# Patient Record
Sex: Male | Born: 1955 | Race: White | Hispanic: No | Marital: Married | Smoking: Never smoker
Health system: Southern US, Community
[De-identification: ages and names within clinical notes are randomized; demographics above are authoritative.]

## PROBLEM LIST (undated history)

## (undated) DIAGNOSIS — N281 Cyst of kidney, acquired: Secondary | ICD-10-CM

## (undated) DIAGNOSIS — I10 Essential (primary) hypertension: Secondary | ICD-10-CM

## (undated) DIAGNOSIS — S86819A Strain of other muscle(s) and tendon(s) at lower leg level, unspecified leg, initial encounter: Secondary | ICD-10-CM

## (undated) DIAGNOSIS — R569 Unspecified convulsions: Secondary | ICD-10-CM

## (undated) DIAGNOSIS — K219 Gastro-esophageal reflux disease without esophagitis: Secondary | ICD-10-CM

## (undated) DIAGNOSIS — Z87442 Personal history of urinary calculi: Secondary | ICD-10-CM

## (undated) DIAGNOSIS — S8010XA Contusion of unspecified lower leg, initial encounter: Secondary | ICD-10-CM

## (undated) DIAGNOSIS — IMO0001 Reserved for inherently not codable concepts without codable children: Secondary | ICD-10-CM

## (undated) DIAGNOSIS — I491 Atrial premature depolarization: Secondary | ICD-10-CM

## (undated) HISTORY — PX: APPENDECTOMY: SHX54

## (undated) HISTORY — DX: Strain of other muscle(s) and tendon(s) at lower leg level, unspecified leg, initial encounter: S86.819A

## (undated) HISTORY — DX: Contusion of unspecified lower leg, initial encounter: S80.10XA

## (undated) HISTORY — DX: Essential (primary) hypertension: I10

## (undated) HISTORY — DX: Cyst of kidney, acquired: N28.1

## (undated) HISTORY — PX: GROIN MASS OPEN BIOPSY: SHX1714

## (undated) HISTORY — PX: FRACTURE SURGERY: SHX138

## (undated) HISTORY — DX: Gastro-esophageal reflux disease without esophagitis: K21.9

---

## 2006-02-21 ENCOUNTER — Ambulatory Visit: Payer: Self-pay | Admitting: Internal Medicine

## 2009-01-31 ENCOUNTER — Ambulatory Visit: Payer: Self-pay | Admitting: Internal Medicine

## 2009-08-23 ENCOUNTER — Ambulatory Visit: Payer: Self-pay | Admitting: Internal Medicine

## 2012-10-09 DIAGNOSIS — R569 Unspecified convulsions: Secondary | ICD-10-CM

## 2012-10-09 HISTORY — DX: Unspecified convulsions: R56.9

## 2013-04-15 ENCOUNTER — Ambulatory Visit: Payer: Self-pay | Admitting: Family Medicine

## 2014-07-02 ENCOUNTER — Other Ambulatory Visit: Payer: Self-pay

## 2014-07-02 ENCOUNTER — Ambulatory Visit: Payer: Self-pay | Admitting: Physician Assistant

## 2014-07-02 LAB — COMPREHENSIVE METABOLIC PANEL
ALBUMIN: 4.1 g/dL (ref 3.4–5.0)
ALK PHOS: 139 U/L — AB
ALT: 62 U/L
ANION GAP: 7 (ref 7–16)
BILIRUBIN TOTAL: 0.6 mg/dL (ref 0.2–1.0)
BUN: 14 mg/dL (ref 7–18)
CALCIUM: 8.9 mg/dL (ref 8.5–10.1)
Chloride: 103 mmol/L (ref 98–107)
Co2: 31 mmol/L (ref 21–32)
Creatinine: 1.58 mg/dL — ABNORMAL HIGH (ref 0.60–1.30)
EGFR (Non-African Amer.): 48 — ABNORMAL LOW
GFR CALC AF AMER: 58 — AB
GLUCOSE: 110 mg/dL — AB (ref 65–99)
OSMOLALITY: 282 (ref 275–301)
Potassium: 4 mmol/L (ref 3.5–5.1)
SGOT(AST): 26 U/L (ref 15–37)
Sodium: 141 mmol/L (ref 136–145)
TOTAL PROTEIN: 7.3 g/dL (ref 6.4–8.2)

## 2014-07-02 LAB — URINALYSIS, COMPLETE
Bilirubin,UR: NEGATIVE
Glucose,UR: NEGATIVE
KETONE: NEGATIVE
Leukocyte Esterase: NEGATIVE
NITRITE: NEGATIVE
PROTEIN: NEGATIVE
Ph: 5.5 (ref 5.0–8.0)
RBC,UR: >30 /HPF (ref 0–5)
SPECIFIC GRAVITY: 1.02 (ref 1.000–1.030)
WBC UR: NONE SEEN /HPF (ref 0–5)

## 2014-07-02 LAB — CBC WITH DIFFERENTIAL/PLATELET
BASOS PCT: 0.5 %
Basophil #: 0 10*3/uL (ref 0.0–0.1)
EOS ABS: 0 10*3/uL (ref 0.0–0.7)
Eosinophil %: 0.5 %
HCT: 49.3 % (ref 40.0–52.0)
HGB: 16.6 g/dL (ref 13.0–18.0)
Lymphocyte #: 1.1 10*3/uL (ref 1.0–3.6)
Lymphocyte %: 12.3 %
MCH: 32.3 pg (ref 26.0–34.0)
MCHC: 33.7 g/dL (ref 32.0–36.0)
MCV: 96 fL (ref 80–100)
MONO ABS: 0.7 x10 3/mm (ref 0.2–1.0)
Monocyte %: 7.5 %
NEUTROS ABS: 7.2 10*3/uL — AB (ref 1.4–6.5)
NEUTROS PCT: 79.2 %
PLATELETS: 118 10*3/uL — AB (ref 150–440)
RBC: 5.15 10*6/uL (ref 4.40–5.90)
RDW: 13.7 % (ref 11.5–14.5)
WBC: 9.1 10*3/uL (ref 3.8–10.6)

## 2014-07-31 DIAGNOSIS — N281 Cyst of kidney, acquired: Secondary | ICD-10-CM | POA: Insufficient documentation

## 2014-07-31 DIAGNOSIS — N200 Calculus of kidney: Secondary | ICD-10-CM | POA: Insufficient documentation

## 2014-07-31 HISTORY — DX: Cyst of kidney, acquired: N28.1

## 2015-02-13 ENCOUNTER — Ambulatory Visit
Admission: EM | Admit: 2015-02-13 | Discharge: 2015-02-13 | Disposition: A | Discharge status: Home / Self Care | Payer: 59 | Attending: Internal Medicine | Admitting: Internal Medicine

## 2015-02-13 DIAGNOSIS — J321 Chronic frontal sinusitis: Secondary | ICD-10-CM | POA: Insufficient documentation

## 2015-02-13 DIAGNOSIS — H66003 Acute suppurative otitis media without spontaneous rupture of ear drum, bilateral: Secondary | ICD-10-CM

## 2015-02-13 DIAGNOSIS — Z79899 Other long term (current) drug therapy: Secondary | ICD-10-CM | POA: Insufficient documentation

## 2015-02-13 DIAGNOSIS — J0110 Acute frontal sinusitis, unspecified: Secondary | ICD-10-CM | POA: Diagnosis not present

## 2015-02-13 DIAGNOSIS — H66013 Acute suppurative otitis media with spontaneous rupture of ear drum, bilateral: Secondary | ICD-10-CM | POA: Insufficient documentation

## 2015-02-13 DIAGNOSIS — J069 Acute upper respiratory infection, unspecified: Secondary | ICD-10-CM | POA: Diagnosis present

## 2015-02-13 HISTORY — DX: Gastro-esophageal reflux disease without esophagitis: K21.9

## 2015-02-13 HISTORY — DX: Unspecified convulsions: R56.9

## 2015-02-13 HISTORY — DX: Reserved for inherently not codable concepts without codable children: IMO0001

## 2015-02-13 LAB — RAPID INFLUENZA A&B ANTIGENS (ARMC ONLY)
INFLUENZA A (ARMC): NOT DETECTED
INFLUENZA B (ARMC): NOT DETECTED

## 2015-02-13 LAB — RAPID STREP SCREEN (MED CTR MEBANE ONLY): STREPTOCOCCUS, GROUP A SCREEN (DIRECT): NEGATIVE

## 2015-02-13 MED ORDER — SALINE SPRAY 0.65 % NA SOLN: 2.0000 | NASAL | Status: DC

## 2015-02-13 MED ORDER — AMOXICILLIN-POT CLAVULANATE 875-125 MG PO TABS: 1.0000 | ORAL_TABLET | Freq: Two times a day (BID) | ORAL | Status: DC

## 2015-02-13 MED ORDER — FLUTICASONE PROPIONATE 50 MCG/ACT NA SUSP: 1.0000 | Freq: Two times a day (BID) | NASAL | Status: DC

## 2015-02-13 NOTE — ED Notes (Signed)
Patient cough, cold, sinus pressure congestion all week, Not getting relief with OTC meds. Thinks it might be moving into his chest

## 2015-02-13 NOTE — ED Provider Notes (Addendum)
CSN: [id number]     Arrival date & time 02/13/15  0810 History   First MD Initiated Contact with Patient 02/13/15 [phone]     Chief Complaint  Patient presents with  . URI   (Consider location/radiation/quality/duration/timing/severity/associated sxs/prior Treatment) Patient is a 59 y.o. male presenting with URI. The history is provided by the patient.  URI Presenting symptoms: congestion, cough, ear pain, facial pain, fatigue, fever, rhinorrhea and sore throat   Congestion:    Location:  Nasal and chest   Interferes with sleep: no     Interferes with eating/drinking: no   Cough:    Cough characteristics:  Productive   Sputum characteristics:  Manson PasseyBrown   Severity:  Moderate   Onset quality:  Gradual   Duration:  1 week   Timing:  Intermittent   Progression:  Worsening   Chronicity:  New Ear pain:    Location:  Bilateral   Severity:  Mild   Onset quality:  Gradual   Duration:  1 week   Progression:  Worsening   Chronicity:  New Fever:    Duration:  2 days   Timing:  Intermittent   Temp source:  Subjective   Progression:  Worsening Rhinorrhea:    Quality:  Yellow   Severity:  Moderate   Duration:  1 week   Timing:  Intermittent   Progression:  Worsening Sore throat:    Severity:  Moderate   Onset quality:  Gradual   Duration:  2 days   Timing:  Constant   Progression:  Worsening Severity:  Moderate Onset quality:  Gradual Duration:  1 week Timing:  Intermittent Progression:  Worsening Chronicity:  New Relieved by:  Nothing Ineffective treatments:  OTC medications and decongestant Associated symptoms: arthralgias, headaches, myalgias, sinus pain and sneezing   Associated symptoms: no neck pain, no swollen glands and no wheezing   Headaches:    Severity:  Moderate   Onset quality:  Gradual   Duration:  2 days   Timing:  Intermittent   Progression:  Worsening   Chronicity:  New Risk factors: sick contacts   Risk factors: no chronic cardiac disease, no chronic  kidney disease, no chronic respiratory disease, no diabetes mellitus, no immunosuppression, no recent illness and no recent travel   Patient reported 61 year old grandson sick when visited him one week ago.  Coughing up brown mucous since 2 May productive cough, worse at night.  Tylenol and sudafed helping but feels bad when they wear off and worse at night symptoms worsening overall in severity.  Forehead headache, nasal and chest congestion, whole head pressure including ears, frequent cough, joint and body aches.  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  Past Medical History  Diagnosis Date  . Seizures   . Reflux   glasses   History reviewed. No pertinent past surgical history. Family History  Problem Relation Age of Onset  . Cancer Mother   . Diabetes Mother    History  Substance Use Topics  . Smoking status: Never Smoker   . Smokeless tobacco: Never Used  . Alcohol Use: No    Review of Systems  Constitutional: Positive for fever, chills and fatigue. Negative for diaphoresis.  HENT: Positive for congestion, ear pain, rhinorrhea, sneezing and sore throat.   Eyes: Negative.   Respiratory: Positive for cough. Negative for apnea, choking, chest tightness, shortness of breath, wheezing and stridor.   Cardiovascular: Negative.   Gastrointestinal: Negative.   Genitourinary: Negative for dysuria and difficulty urinating.  Musculoskeletal: Positive for  myalgias and arthralgias. Negative for neck pain.  Skin: Negative.   Allergic/Immunologic: Negative.   Neurological: Positive for headaches. Negative for dizziness, tremors, seizures, syncope, facial asymmetry, speech difficulty, weakness, light-headedness and numbness.  Hematological: Negative.   Psychiatric/Behavioral: Negative.     Allergies  Review of patient's allergies indicates no known allergies.  Home Medications   Prior to Admission medications   Medication Sig Start Date End Date Taking? Authorizing Provider  omeprazole (PRILOSEC) 40 MG  capsule Take 40 mg by mouth daily.   Yes Historical Provider, MD  OXcarbazepine (TRILEPTAL) 150 MG tablet Take 150 mg by mouth 2 (two) times daily.   Yes Historical Provider, MD  amoxicillin-clavulanate (AUGMENTIN) 875-125 MG per tablet Take 1 tablet by mouth 2 (two) times daily. 02/13/15   Barbaraann Barthel, NP  fluticasone (FLONASE) 50 MCG/ACT nasal spray Place 1 spray into both nostrils 2 (two) times daily. 02/13/15   Barbaraann Barthel, NP  sodium chloride (OCEAN) 0.65 % SOLN nasal spray Place 2 sprays into both nostrils every 2 (two) hours while awake. 02/13/15   Jarold Song Shakeitha Umbaugh, NP   BP 163/89 mmHg  Pulse 62  Temp(Src) 98 F (36.7 C) (Tympanic)  Resp 20  Ht  (1.93 m)  Wt 238 lb (107.956 kg)  BMI 28.98 kg/m2  SpO2 98% Physical Exam  Constitutional: He is oriented to person, place, and time. Vital signs are normal. He appears well-developed and well-nourished.  HENT:  Head: Normocephalic and atraumatic.  Right Ear: Hearing, external ear and ear canal normal. Tympanic membrane is injected and erythematous. A middle ear effusion is present.  Left Ear: Hearing, external ear and ear canal normal. Tympanic membrane is injected and erythematous. A middle ear effusion is present.  Nose: Nose normal.  Mouth/Throat: Oropharynx is clear and moist. No oropharyngeal exudate.  Vascular excoriated bilateral TMs with erythema and opacity to air fluid level 50% left greater than right; cobblestoning posterior pharynx; bilateral turbinates with edema/erythema clear discharge; TTP maxillary and frontal sinuses right greater than left  Eyes: Conjunctivae, EOM and lids are normal. Pupils are equal, round, and reactive to light. Right eye exhibits no discharge. Left eye exhibits no discharge. No scleral icterus.  Neck: Trachea normal and normal range of motion. Neck supple. No JVD present. No tracheal deviation present. No thyromegaly present.  Cardiovascular: Normal rate, regular rhythm, normal heart  sounds and intact distal pulses.  Exam reveals no gallop and no friction rub.   No murmur heard. Pulmonary/Chest: Effort normal and breath sounds normal. No stridor. No respiratory distress. He has no wheezes. He has no rales. He exhibits no tenderness.  Abdominal: He exhibits no distension.  Musculoskeletal: Normal range of motion. He exhibits no edema or tenderness.  Lymphadenopathy:    He has no cervical adenopathy.  Neurological: He is alert and oriented to person, place, and time.  Skin: Skin is warm, dry and intact. No rash noted. No erythema. No pallor.  Psychiatric: He has a normal mood and affect. His speech is normal and behavior is normal. Judgment and thought content normal. Cognition and memory are normal.  Nursing note and vitals reviewed.   ED Course  Procedures (including critical care time) Labs Review Labs Reviewed  INFLUENZA A&B ANTIGENS([facility])  RAPID STREP SCREEN  CULTURE, GROUP A STREP [facility])  1030 Patient notified rapid strep and flu negative.  Throat culture pending over next 48 hours and will contact him via telephone with results once available.  Patient verbalized understanding of information/instructions,  agreed with plan of care and had no further questions at this time.  Imaging Review No results found.  Non toxic and well hydrated.  I do not see where any further testing or imaging is necessary at this time.   I will suggest supportive care, rest, good hygiene and encourage the patient to take adequate fluids.  The patient is to return to clinic or EMERGENCY ROOM if symptoms worsen or change significantly.  Exitcare handout on sinusitis given to patient. Symptomatic therapy suggested fluids, NSAIDs and rest.  May take Tylenol or Motrin for fevers.  Call or return to clinic as needed if these symptoms worsen or fail to improve as anticipated. Exitcare handout on otitis media given to patient.   Patient verbalized agreement and understanding of treatment plan and  had no further questions at this time.   P2:  Hand washing and cover cough  MDM   1. Acute frontal sinusitis, recurrence not specified   2. Acute suppurative otitis media of both ears without spontaneous rupture of tympanic membranes, recurrence not specified    17 Feb 2015 at 0742 patient contacted via telephone and notified throat culture negative for strep throat.  Patient reported symptoms gradually improving with prescribed therapy.  Patient verbalized understanding of information and had no further questions at this time.  Barbaraann Barthelina A Carder Yin, NP 02/13/15 1135  Barbaraann Barthelina A Christianna Belmonte, NP 02/17/15 [phone]

## 2015-02-13 NOTE — Discharge Instructions (Signed)
Sinusitis  Sinusitis is redness, soreness, and inflammation of the paranasal sinuses. Paranasal sinuses are air pockets within the bones of your face (beneath the eyes, the middle of the forehead, or above the eyes). In healthy paranasal sinuses, mucus is able to drain out, and air is able to circulate through them by way of your nose. However, when your paranasal sinuses are inflamed, mucus and air can become trapped. This can allow bacteria and other germs to grow and cause infection.  Sinusitis can develop quickly and last only a short time (acute) or continue over a long period (chronic). Sinusitis that lasts for more than 12 weeks is considered chronic.   CAUSES   Causes of sinusitis include:   Allergies.   Structural abnormalities, such as displacement of the cartilage that separates your nostrils (deviated septum), which can decrease the air flow through your nose and sinuses and affect sinus drainage.   Functional abnormalities, such as when the small hairs (cilia) that line your sinuses and help remove mucus do not work properly or are not present.  SIGNS AND SYMPTOMS   Symptoms of acute and chronic sinusitis are the same. The primary symptoms are pain and pressure around the affected sinuses. Other symptoms include:   Upper toothache.   Earache.   Headache.   Bad breath.   Decreased sense of smell and taste.   A cough, which worsens when you are lying flat.   Fatigue.   Fever.   Thick drainage from your nose, which often is green and may contain pus (purulent).   Swelling and warmth over the affected sinuses.  DIAGNOSIS   Your health care provider will perform a physical exam. During the exam, your health care provider may:   Look in your nose for signs of abnormal growths in your nostrils (nasal polyps).   Tap over the affected sinus to check for signs of infection.   View the inside of your sinuses (endoscopy) using an imaging device that has a light attached (endoscope).  If your health  care provider suspects that you have chronic sinusitis, one or more of the following tests may be recommended:   Allergy tests.   Nasal culture. A sample of mucus is taken from your nose, sent to a lab, and screened for bacteria.   Nasal cytology. A sample of mucus is taken from your nose and examined by your health care provider to determine if your sinusitis is related to an allergy.  TREATMENT   Most cases of acute sinusitis are related to a viral infection and will resolve on their own within 10 days. Sometimes medicines are prescribed to help relieve symptoms (pain medicine, decongestants, nasal steroid sprays, or saline sprays).   However, for sinusitis related to a bacterial infection, your health care provider will prescribe antibiotic medicines. These are medicines that will help kill the bacteria causing the infection.   Rarely, sinusitis is caused by a fungal infection. In theses cases, your health care provider will prescribe antifungal medicine.  For some cases of chronic sinusitis, surgery is needed. Generally, these are cases in which sinusitis recurs more than 3 times per year, despite other treatments.  HOME CARE INSTRUCTIONS    Drink plenty of water. Water helps thin the mucus so your sinuses can drain more easily.   Use a humidifier.   Inhale steam 3 to 4 times a day (for example, sit in the bathroom with the shower running).   Apply a warm, moist washcloth to your face   3 to 4 times a day, or as directed by your health care provider.   Use saline nasal sprays to help moisten and clean your sinuses.   Take medicines only as directed by your health care provider.   If you were prescribed either an antibiotic or antifungal medicine, finish it all even if you start to feel better.  SEEK IMMEDIATE MEDICAL CARE IF:   You have increasing pain or severe headaches.   You have nausea, vomiting, or drowsiness.   You have swelling around your face.   You have vision problems.   You have a stiff  neck.   You have difficulty breathing.  MAKE SURE YOU:    Understand these instructions.   Will watch your condition.   Will get help right away if you are not doing well or get worse.  Document Released: 09/25/2005 Document Revised: 02/09/2014 Document Reviewed: 10/10/2011  ExitCare Patient Information 2015 [employer]. This information is not intended to replace advice given to you by your health care provider. Make sure you discuss any questions you have with your health care provider.  Otitis Media  Otitis media is redness, soreness, and inflammation of the middle ear. Otitis media may be caused by allergies or, most commonly, by infection. Often it occurs as a complication of the common cold.  SIGNS AND SYMPTOMS  Symptoms of otitis media may include:   Earache.   Fever.   Ringing in your ear.   Headache.   Leakage of fluid from the ear.  DIAGNOSIS  To diagnose otitis media, your health care provider will examine your ear with an otoscope. This is an instrument that allows your health care provider to see into your ear in order to examine your eardrum. Your health care provider also will ask you questions about your symptoms.  TREATMENT   Typically, otitis media resolves on its own within 3-5 days. Your health care provider may prescribe medicine to ease your symptoms of pain. If otitis media does not resolve within 5 days or is recurrent, your health care provider may prescribe antibiotic medicines if he or she suspects that a bacterial infection is the cause.  HOME CARE INSTRUCTIONS    If you were prescribed an antibiotic medicine, finish it all even if you start to feel better.   Take medicines only as directed by your health care provider.   Keep all follow-up visits as directed by your health care provider.  SEEK MEDICAL CARE IF:   You have otitis media only in one ear, or bleeding from your nose, or both.   You notice a lump on your neck.   You are not getting better in 3-5 days.   You  feel worse instead of better.  SEEK IMMEDIATE MEDICAL CARE IF:    You have pain that is not controlled with medicine.   You have swelling, redness, or pain around your ear or stiffness in your neck.   You notice that part of your face is paralyzed.   You notice that the bone behind your ear (mastoid) is tender when you touch it.  MAKE SURE YOU:    Understand these instructions.   Will watch your condition.   Will get help right away if you are not doing well or get worse.  Document Released: 06/30/2004 Document Revised: 02/09/2014 Document Reviewed: 04/22/2013  ExitCare Patient Information 2015 [employer]. This information is not intended to replace advice given to you by your health care provider. Make sure you   discuss any questions you have with your health care provider.

## 2015-02-16 LAB — CULTURE, GROUP A STREP (THRC)

## 2016-02-03 ENCOUNTER — Encounter: Payer: Self-pay | Admitting: *Deleted

## 2016-02-03 ENCOUNTER — Ambulatory Visit
Admission: EM | Admit: 2016-02-03 | Discharge: 2016-02-03 | Disposition: A | Discharge status: Home / Self Care | Payer: 59 | Attending: Family Medicine | Admitting: Family Medicine

## 2016-02-03 DIAGNOSIS — J0100 Acute maxillary sinusitis, unspecified: Secondary | ICD-10-CM | POA: Diagnosis not present

## 2016-02-03 LAB — RAPID STREP SCREEN (MED CTR MEBANE ONLY): Streptococcus, Group A Screen (Direct): NEGATIVE

## 2016-02-03 LAB — RAPID INFLUENZA A&B ANTIGENS: Influenza B (ARMC): NEGATIVE

## 2016-02-03 LAB — RAPID INFLUENZA A&B ANTIGENS (ARMC ONLY): INFLUENZA A (ARMC): NEGATIVE

## 2016-02-03 MED ORDER — FEXOFENADINE-PSEUDOEPHED ER 180-240 MG PO TB24: 1.0000 | ORAL_TABLET | Freq: Every day | ORAL | Status: DC

## 2016-02-03 MED ORDER — FLUTICASONE PROPIONATE 50 MCG/ACT NA SUSP: 2.0000 | Freq: Every day | NASAL | Status: DC

## 2016-02-03 MED ORDER — AMOXICILLIN-POT CLAVULANATE 875-125 MG PO TABS: 1.0000 | ORAL_TABLET | Freq: Two times a day (BID) | ORAL | Status: DC

## 2016-02-03 NOTE — ED Notes (Signed)
Productive cough-green, fever, sore throat, headache, body aches, fatigue, x1 week.

## 2016-02-03 NOTE — Discharge Instructions (Signed)
Sinusitis, Adult  Sinusitis is redness, soreness, and puffiness (inflammation) of the air pockets in the bones of your face (sinuses). The redness, soreness, and puffiness can cause air and mucus to get trapped in your sinuses. This can allow germs to grow and cause an infection.   HOME CARE    Drink enough fluids to keep your pee (urine) clear or pale yellow.   Use a humidifier in your home.   Run a hot shower to create steam in the bathroom. Sit in the bathroom with the door closed. Breathe in the steam 3-4 times a day.   Put a warm, moist washcloth on your face 3-4 times a day, or as told by your doctor.   Use salt water sprays (saline sprays) to wet the thick fluid in your nose. This can help the sinuses drain.   Only take medicine as told by your doctor.  GET HELP RIGHT AWAY IF:    Your pain gets worse.   You have very bad headaches.   You are sick to your stomach (nauseous).   You throw up (vomit).   You are very sleepy (drowsy) all the time.   Your face is puffy (swollen).   Your vision changes.   You have a stiff neck.   You have trouble breathing.  MAKE SURE YOU:    Understand these instructions.   Will watch your condition.   Will get help right away if you are not doing well or get worse.     This information is not intended to replace advice given to you by your health care provider. Make sure you discuss any questions you have with your health care provider.     Document Released: 03/13/2008 Document Revised: 10/16/2014 Document Reviewed: 04/30/2012  Elsevier Interactive Patient Education 2016 [employer]

## 2016-02-03 NOTE — ED Provider Notes (Addendum)
CSN: [id number]     Arrival date & time 02/03/16  1423 History   First MD Initiated Contact with Patient 02/03/16 1507    Nurses notes were reviewed.  Chief Complaint  Patient presents with  . Cough  . Fever  . Sore Throat  . Headache  . Generalized Body Aches  . Fatigue    Patient was having a sinus infection for over week. No screen drainage coming from his nostril. States that he is not able to shake it. He's also had fever sore throat headache and generalized body aches with it as well. He states that he's not been able to have any improvement in the last 2 days is gotten a lot worse. Past medical history he's a history of reflux and seizure disorder. His mother's had cancer and mother also said diabetes. He never smoked and denies any medical allergies or significant medical problems other than mentioned above.    (Consider location/radiation/quality/duration/timing/severity/associated sxs/prior Treatment) Patient is a 60 y.o. male presenting with cough, fever, pharyngitis, and headaches.  Cough Cough characteristics:  Productive and non-productive Sputum characteristics:  Green, yellow and bloody Severity:  Moderate Onset quality:  Sudden Duration:  7 days Timing:  Constant Progression:  Worsening Chronicity:  New Context: upper respiratory infection   Context: not sick contacts and not smoke exposure   Relieved by:  Nothing Worsened by:  Nothing tried Associated symptoms: fever and headaches   Fever Associated symptoms: cough and headaches   Sore Throat Associated symptoms include headaches.  Headache Associated symptoms: cough and fever     Past Medical History  Diagnosis Date  . Seizures (HCC)   . Reflux    History reviewed. No pertinent past surgical history. Family History  Problem Relation Age of Onset  . Cancer Mother   . Diabetes Mother    Social History  Substance Use Topics  . Smoking status: Never Smoker   . Smokeless tobacco: Never Used  .  Alcohol Use: No    Review of Systems  Constitutional: Positive for fever.  Respiratory: Positive for cough.   Neurological: Positive for headaches.  All other systems reviewed and are negative.   Allergies  Review of patient's allergies indicates no known allergies.  Home Medications   Prior to Admission medications   Medication Sig Start Date End Date Taking? Authorizing Provider  omeprazole (PRILOSEC) 40 MG capsule Take 40 mg by mouth daily.   Yes Historical Provider, MD  OXcarbazepine (TRILEPTAL) 150 MG tablet Take 150 mg by mouth 2 (two) times daily.   Yes Historical Provider, MD  amoxicillin-clavulanate (AUGMENTIN) 875-125 MG per tablet Take 1 tablet by mouth 2 (two) times daily. 02/13/15   Barbaraann Barthel, NP  amoxicillin-clavulanate (AUGMENTIN) 875-125 MG tablet Take 1 tablet by mouth 2 (two) times daily. 02/03/16   Hassan Rowan, MD  fexofenadine-pseudoephedrine (ALLEGRA-D ALLERGY & CONGESTION) 180-240 MG 24 hr tablet Take 1 tablet by mouth daily. 02/03/16   Hassan Rowan, MD  fluticasone (FLONASE) 50 MCG/ACT nasal spray Place 1 spray into both nostrils 2 (two) times daily. 02/13/15   Barbaraann Barthel, NP  fluticasone (FLONASE) 50 MCG/ACT nasal spray Place 2 sprays into both nostrils daily. 02/03/16   Hassan Rowan, MD  sodium chloride (OCEAN) 0.65 % SOLN nasal spray Place 2 sprays into both nostrils every 2 (two) hours while awake. 02/13/15   Barbaraann Barthel, NP   Meds Ordered and Administered this Visit  Medications - No data to display  BP 164/94 mmHg  Pulse 67  Temp(Src) 98.2 F (36.8 C) (Oral)  Resp 16  Ht 6\' 4"  (1.93 m)  Wt 240 lb (108.863 kg)  BMI 29.23 kg/m2  SpO2 99% No data found.   Physical Exam  Constitutional: He is oriented to person, place, and time. He appears well-developed and well-nourished.  HENT:  Head: Normocephalic and atraumatic.  Eyes: Conjunctivae are normal.  Neck: Normal range of motion. Neck supple.  Cardiovascular: Normal rate and regular  rhythm.   Pulmonary/Chest: Effort normal and breath sounds normal.  Musculoskeletal: Normal range of motion. He exhibits no edema.  Neurological: He is alert and oriented to person, place, and time.  Skin: Skin is warm and dry.  Vitals reviewed.   ED Course  Procedures (including critical care time)  Labs Review Labs Reviewed  RAPID INFLUENZA A&B ANTIGENS ([facility] ONLY)  [facility]  CULTURE, GROUP A STREP [facility])    Imaging Review No results found.   Visual Acuity Review  Right Eye Distance:   Left Eye Distance:   Bilateral Distance:    Right Eye Near:   Left Eye Near:    Bilateral Near:       Results for orders placed or performed during the hospital encounter of 02/03/16  Rapid Influenza A&B Antigens ([facility] only)  Result Value Ref Range   Influenza A ([facility]) NEGATIVE NEGATIVE   Influenza B ([facility]) NEGATIVE NEGATIVE  Rapid strep screen  Result Value Ref Range   Streptococcus, Group A Screen (Direct) NEGATIVE NEGATIVE    MDM   1. Acute maxillary sinusitis, recurrence not specified    We'll place patient on Augmentin 875 one tablet twice a day. Allegra-D decongestant 1 tablet daily and Flonase nasal spray 2 puffs each nostril on a daily basis. Follow-up with PCP if not better in a week.      Note: This dictation was prepared with Dragon dictation along with smaller phrase technology. Any transcriptional errors that result from this process are unintentional.    Hassan RowanEugene Lillyth Spong, MD 02/03/16 1527  Hassan RowanEugene Annleigh Knueppel, MD 02/03/16 1527

## 2016-02-06 LAB — CULTURE, GROUP A STREP (THRC)

## 2016-03-10 DIAGNOSIS — S8010XA Contusion of unspecified lower leg, initial encounter: Secondary | ICD-10-CM | POA: Insufficient documentation

## 2016-03-10 DIAGNOSIS — S86819A Strain of other muscle(s) and tendon(s) at lower leg level, unspecified leg, initial encounter: Secondary | ICD-10-CM | POA: Insufficient documentation

## 2016-03-10 HISTORY — DX: Strain of other muscle(s) and tendon(s) at lower leg level, unspecified leg, initial encounter: S86.819A

## 2016-03-10 HISTORY — DX: Contusion of unspecified lower leg, initial encounter: S80.10XA

## 2016-04-10 DIAGNOSIS — M79606 Pain in leg, unspecified: Secondary | ICD-10-CM | POA: Insufficient documentation

## 2016-05-25 IMAGING — CT CT ABD-PELV W/O CM
2 of 4 series · 16 of 46 positions shown, 18 images · non-contrast
Comparison: None.

CLINICAL DATA: Left lower quadrant pain, hematuria

EXAM:
CT ABDOMEN AND PELVIS WITHOUT CONTRAST
TECHNIQUE: Multidetector CT imaging of the abdomen and pelvis was performed
following the standard protocol without IV contrast.

[Series 2: stone standard full · axial · 0.96mm/px · z∈[-1078,-563]mm · 13 of 113 slices shown, 15 images]
[im 5/113  soft-tissue]
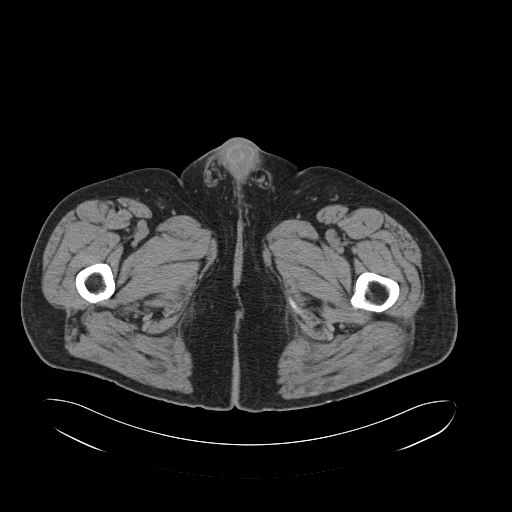
[im 5/113  bone]
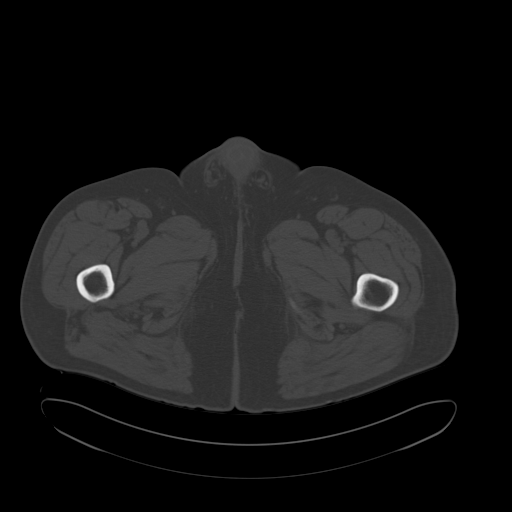
[im 13/113  soft-tissue]
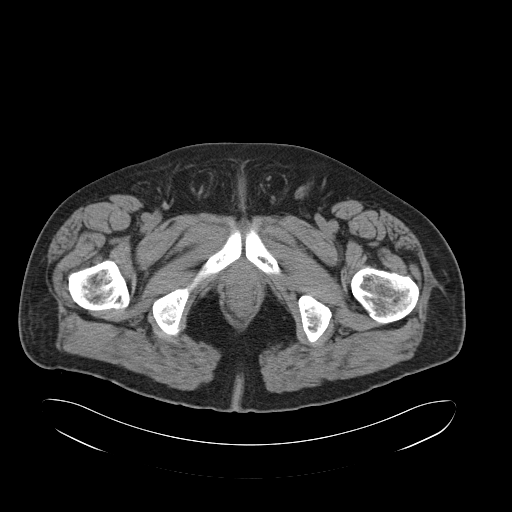
[im 22/113  soft-tissue]
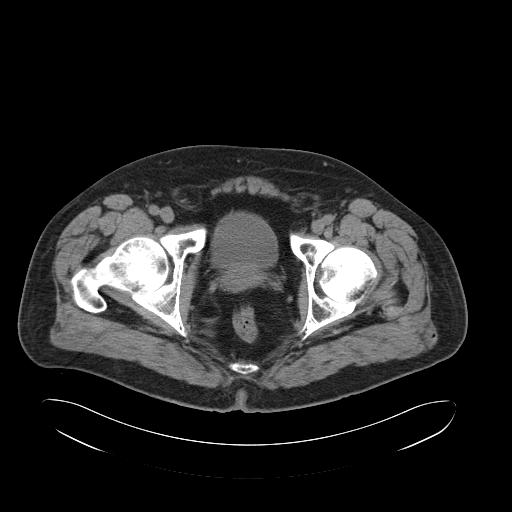
[im 31/113  soft-tissue]
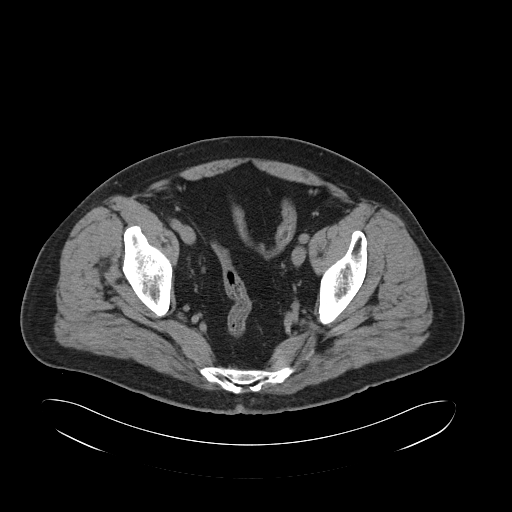
[im 39/113  soft-tissue]
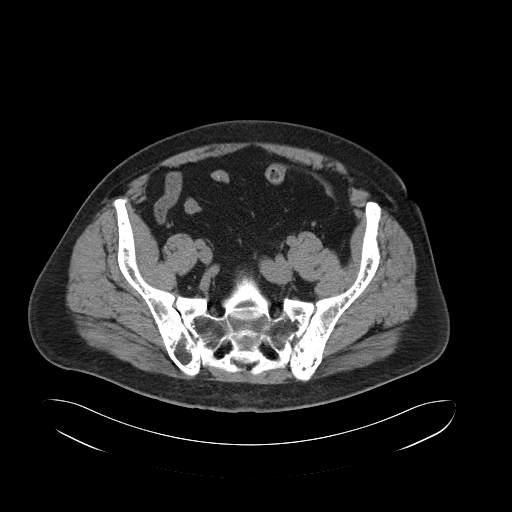
[im 48/113  soft-tissue]
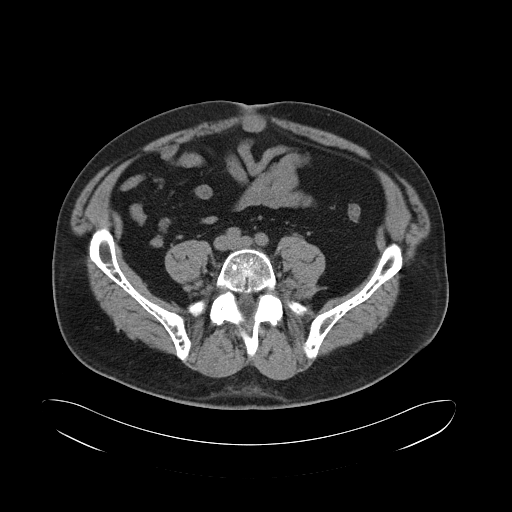
[im 57/113  soft-tissue]
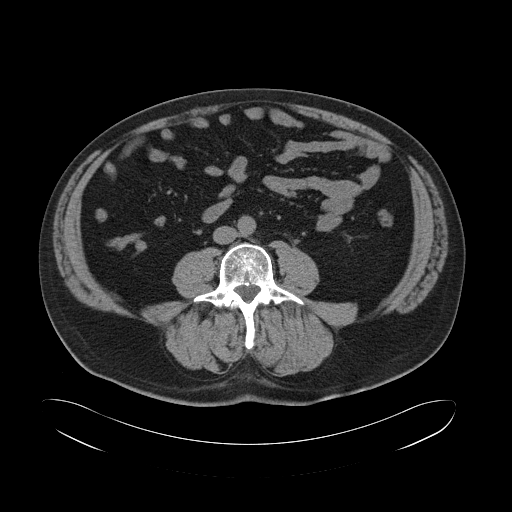
[im 65/113  soft-tissue]
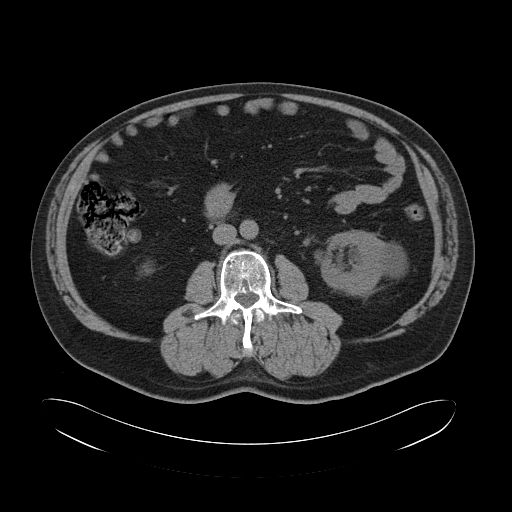
[im 74/113  soft-tissue]
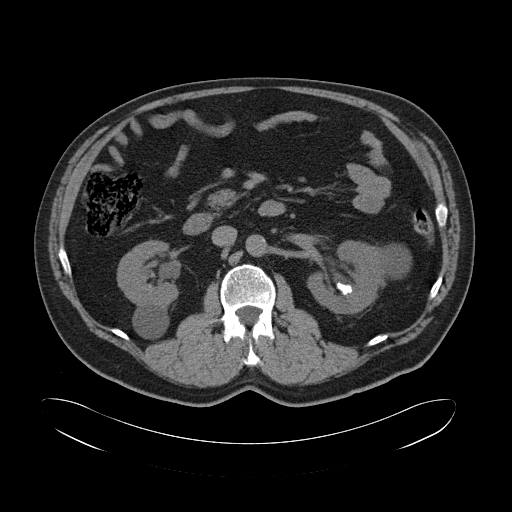
[im 74/113  bone]
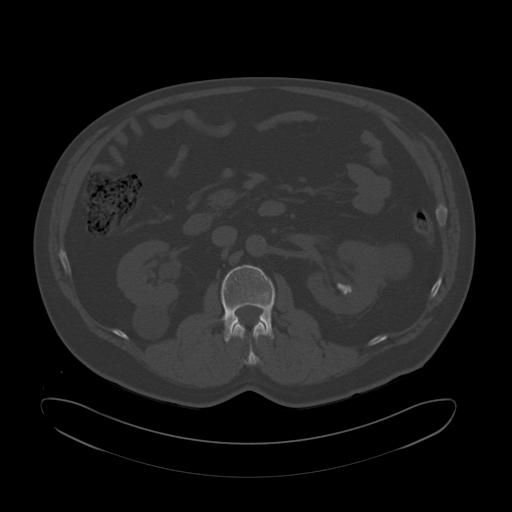
[im 82/113  soft-tissue]
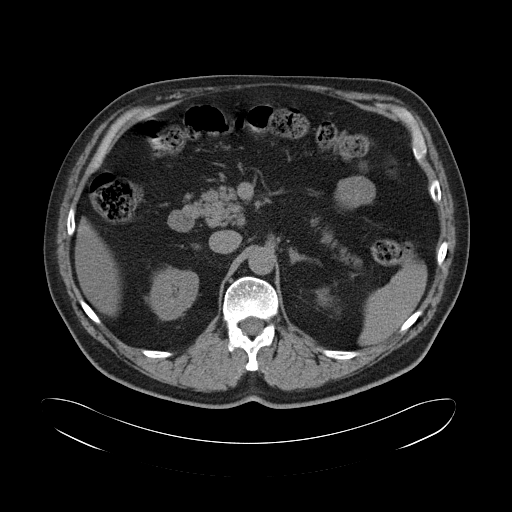
[im 91/113  soft-tissue]
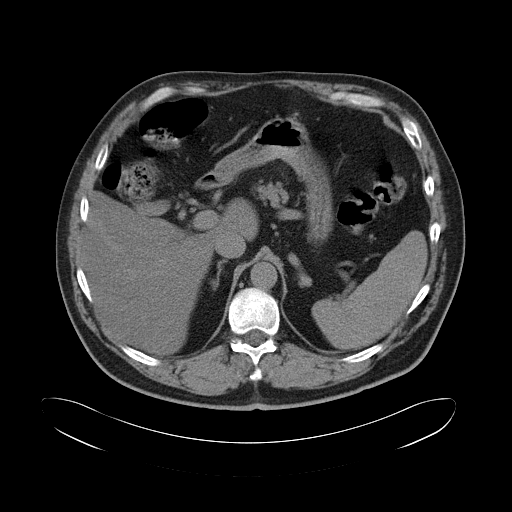
[im 100/113  soft-tissue]
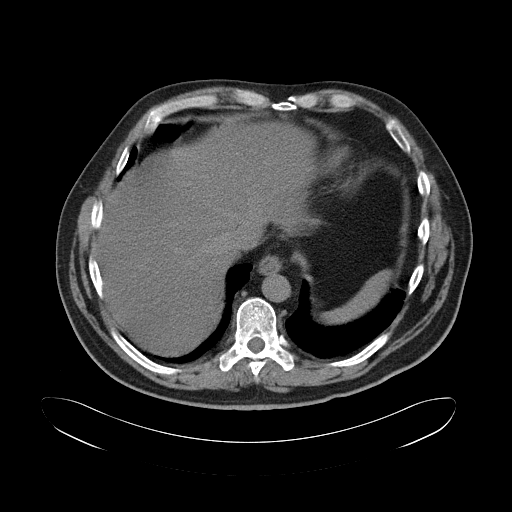
[im 108/113  soft-tissue]
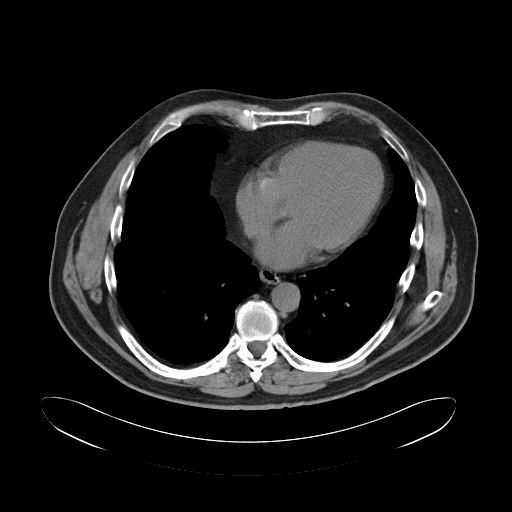

[Series 5: cor stone standard full · coronal · 0.89mm/px · 3 of 160 slices shown]
[im 54/160  soft-tissue]
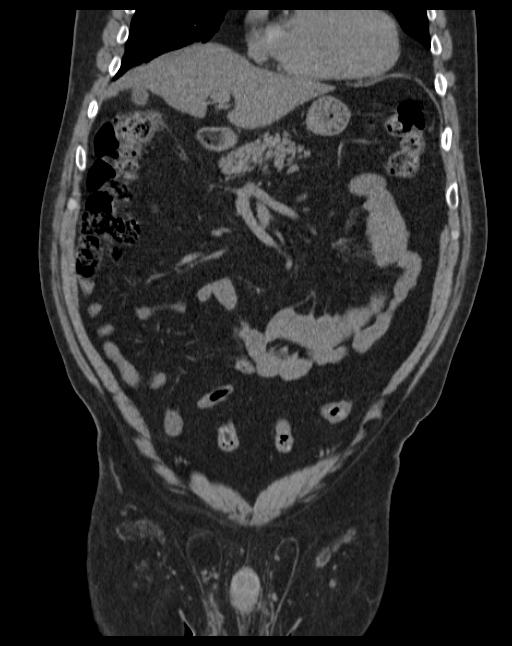
[im 71/160  soft-tissue]
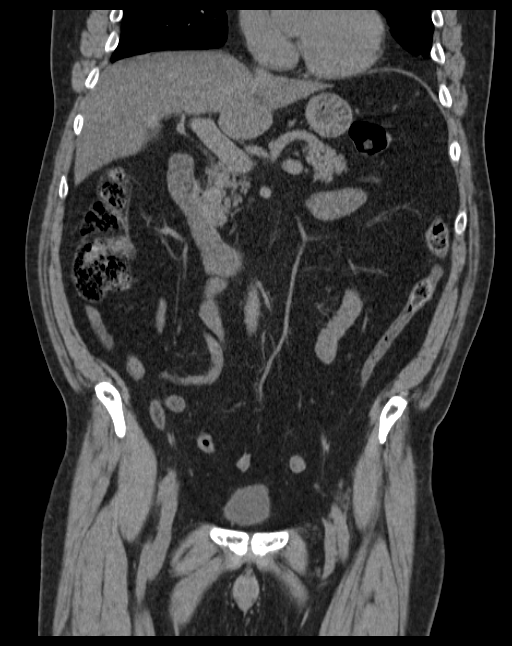
[im 89/160  soft-tissue]
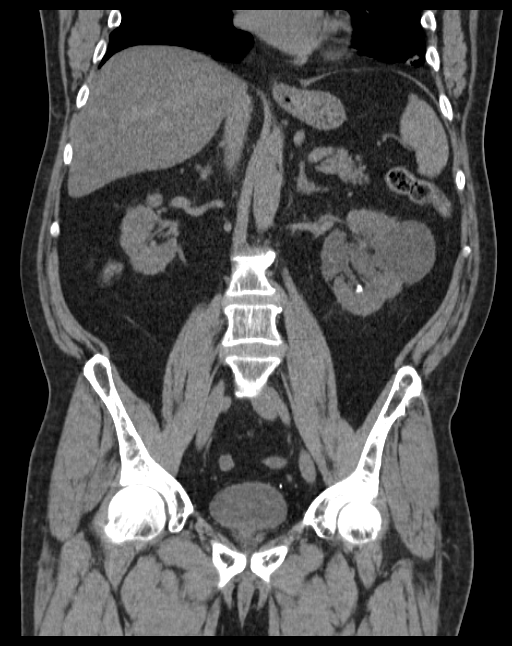

[16 of 46 positions shown; findings below may reference images not displayed]

FINDINGS: Mild subsegmental atelectasis seen dependently within the lung
bases. No pleural or pericardial effusion.

Diffuse hypoattenuation of the liver is consistent with steatosis.
Gallbladder is largely contracted but grossly normal. No biliary
dilatation. The spleen, adrenal glands, and pancreas demonstrate a
normal unenhanced appearance.

4 cm exophytic cyst seen extending from the posterior aspect of the
right kidney. Faint punctate calcification within the lower pole the
right kidney likely represents a small nonobstructive stone. No
hydronephrosis. No stones seen along the course of the right renal
collecting system.

On the left, there is an obstructive 4 mm calculus within the mid
left ureter (series 2, image 63). There is secondary mild to
moderate left hydroureteronephrosis. Additional large 1.1 x 1.5 cm
calculus present within the upper left kidney. 7 mm nonobstructive
calculus present within the lower pole left kidney. 5.5 x 5.7 cm
simple exophytic cyst present.

Stomach within normal limits. No evidence of bowel obstruction. No
abnormal wall thickening or inflammatory changes seen about the
bowels. No evidence for acute appendicitis.

Bilateral fat containing inguinal hernias present, right larger than
left

Bladder is largely decompressed but grossly normal. Prostate
unremarkable.

No free air or fluid.  No adenopathy.

No acute osseous abnormality. No worrisome lytic or blastic osseous
lesions. Benign hemangiomata noted within the T11 and L1 vertebral
bodies.
IMPRESSION: 1. Obstructive 4 mm calculus within the mid left ureter with
secondary mild to moderate left hydroureteronephrosis.
2. Additional nonobstructive left renal nephrolithiasis as above,
the largest of which measures 1.1 x 1.5 cm.
3. Punctate calcific density within the lower pole of the right
kidney, likely a small nonobstructive stone.
4. Bilateral simple renal cysts as above.
5. Hepatic steatosis.

## 2017-11-13 DIAGNOSIS — R569 Unspecified convulsions: Secondary | ICD-10-CM | POA: Insufficient documentation

## 2017-11-13 DIAGNOSIS — K219 Gastro-esophageal reflux disease without esophagitis: Secondary | ICD-10-CM

## 2017-11-13 DIAGNOSIS — I10 Essential (primary) hypertension: Secondary | ICD-10-CM | POA: Insufficient documentation

## 2017-11-13 HISTORY — DX: Gastro-esophageal reflux disease without esophagitis: K21.9

## 2017-11-13 HISTORY — DX: Essential (primary) hypertension: I10

## 2017-11-18 ENCOUNTER — Other Ambulatory Visit: Payer: Self-pay

## 2017-11-18 ENCOUNTER — Ambulatory Visit
Admission: EM | Admit: 2017-11-18 | Discharge: 2017-11-18 | Disposition: A | Discharge status: Home / Self Care | Payer: 59 | Attending: Family Medicine | Admitting: Family Medicine

## 2017-11-18 DIAGNOSIS — J0100 Acute maxillary sinusitis, unspecified: Secondary | ICD-10-CM | POA: Diagnosis not present

## 2017-11-18 HISTORY — DX: Essential (primary) hypertension: I10

## 2017-11-18 LAB — RAPID STREP SCREEN (MED CTR MEBANE ONLY): Streptococcus, Group A Screen (Direct): NEGATIVE

## 2017-11-18 MED ORDER — AMOXICILLIN 875 MG PO TABS: 875.0000 mg | ORAL_TABLET | Freq: Two times a day (BID) | ORAL | 0 refills | Status: DC

## 2017-11-18 NOTE — ED Provider Notes (Signed)
[facility]    CSN: [id number] Arrival date & time: 11/18/17  1523     History   Chief Complaint Chief Complaint  Patient presents with  . Nasal Congestion    HPI Tom Bauer is a 62 y.o. male.   The history is provided by the patient.  URI  Presenting symptoms: congestion, cough, facial pain, fatigue and sore throat   Severity:  Moderate Onset quality:  Sudden Duration:  10 days Timing:  Constant Progression:  Worsening Chronicity:  New Relieved by:  Nothing Ineffective treatments:  OTC medications Associated symptoms: headaches and sinus pain   Associated symptoms: no wheezing   Risk factors: sick contacts   Risk factors: not elderly, no chronic cardiac disease, no chronic kidney disease, no chronic respiratory disease, no diabetes mellitus, no immunosuppression, no recent illness and no recent travel     Past Medical History:  Diagnosis Date  . Hypertension   . Reflux   . Seizures (HCC)     There are no active problems to display for this patient.   History reviewed. No pertinent surgical history.     Home Medications    Prior to Admission medications   Medication Sig Start Date End Date Taking? Authorizing Provider  losartan-hydrochlorothiazide (HYZAAR) 100-12.5 MG tablet Take 1 tablet by mouth daily.   Yes [provider]  omeprazole (PRILOSEC) 40 MG capsule Take 40 mg by mouth daily.   Yes [provider]  OXcarbazepine (TRILEPTAL) 150 MG tablet Take 150 mg by mouth 2 (two) times daily.   Yes [provider]  amoxicillin (AMOXIL) 875 MG tablet Take 1 tablet (875 mg total) by mouth 2 (two) times daily. 11/18/17   Payton Mccallum, MD  amoxicillin-clavulanate (AUGMENTIN) 875-125 MG per tablet Take 1 tablet by mouth 2 (two) times daily. 02/13/15   Betancourt, Jarold Song, NP  amoxicillin-clavulanate (AUGMENTIN) 875-125 MG tablet Take 1 tablet by mouth 2 (two) times daily. 02/03/16   Hassan Rowan, MD    fexofenadine-pseudoephedrine (ALLEGRA-D ALLERGY & CONGESTION) 180-240 MG 24 hr tablet Take 1 tablet by mouth daily. 02/03/16   Hassan Rowan, MD  fluticasone (FLONASE) 50 MCG/ACT nasal spray Place 1 spray into both nostrils 2 (two) times daily. 02/13/15   Betancourt, Jarold Song, NP  fluticasone (FLONASE) 50 MCG/ACT nasal spray Place 2 sprays into both nostrils daily. 02/03/16   Hassan Rowan, MD  sodium chloride (OCEAN) 0.65 % SOLN nasal spray Place 2 sprays into both nostrils every 2 (two) hours while awake. 02/13/15   Betancourt, Jarold Song, NP    Family History Family History  Problem Relation Age of Onset  . Cancer Mother   . Diabetes Mother     Social History Social History   Tobacco Use  . Smoking status: Never Smoker  . Smokeless tobacco: Never Used  Substance Use Topics  . Alcohol use: No  . Drug use: No     Allergies   Patient has no known allergies.   Review of Systems Review of Systems  Constitutional: Positive for fatigue.  HENT: Positive for congestion, sinus pain and sore throat.   Respiratory: Positive for cough. Negative for wheezing.   Neurological: Positive for headaches.     Physical Exam Triage Vital Signs ED Triage Vitals  Enc Vitals Group     BP 11/18/17 1548 127/81     Pulse Rate 11/18/17 1548 88     Resp --      Temp 11/18/17 1548 (!) 97.5 F (36.4 C)  Temp Source 11/18/17 1548 Oral     SpO2 11/18/17 1548 99 %     Weight 11/18/17 1544 250 lb (113.4 kg)     Height 11/18/17 1544 6\' 4"  (1.93 m)     Head Circumference --      Peak Flow --      Pain Score 11/18/17 1544 7     Pain Loc --      Pain Edu? --      Excl. in GC? --    No data found.  Updated Vital Signs BP 127/81 (BP Location: Left Arm)   Pulse 88   Temp (!) 97.5 F (36.4 C) (Oral)   Ht 6\' 4"  (1.93 m)   Wt 250 lb (113.4 kg)   SpO2 99%   BMI 30.43 kg/m   Visual Acuity Right Eye Distance:   Left Eye Distance:   Bilateral Distance:    Right Eye Near:   Left Eye Near:     Bilateral Near:     Physical Exam  Constitutional: He appears well-developed and well-nourished. No distress.  HENT:  Head: Normocephalic and atraumatic.  Right Ear: Tympanic membrane, external ear and ear canal normal.  Left Ear: Tympanic membrane, external ear and ear canal normal.  Nose: Right sinus exhibits maxillary sinus tenderness and frontal sinus tenderness. Left sinus exhibits maxillary sinus tenderness and frontal sinus tenderness.  Mouth/Throat: Uvula is midline, oropharynx is clear and moist and mucous membranes are normal. No oropharyngeal exudate or tonsillar abscesses.  Eyes: Conjunctivae and EOM are normal. Pupils are equal, round, and reactive to light. Right eye exhibits no discharge. Left eye exhibits no discharge. No scleral icterus.  Neck: Normal range of motion. Neck supple. No tracheal deviation present. No thyromegaly present.  Cardiovascular: Normal rate, regular rhythm and normal heart sounds.  Pulmonary/Chest: Effort normal and breath sounds normal. No stridor. No respiratory distress. He has no wheezes. He has no rales. He exhibits no tenderness.  Lymphadenopathy:    He has no cervical adenopathy.  Neurological: He is alert.  Skin: Skin is warm and dry. No rash noted. He is not diaphoretic.  Nursing note and vitals reviewed.    UC Treatments / Results  Labs (all labs ordered are listed, but only abnormal results are displayed) Labs Reviewed  RAPID STREP SCREEN (NOT AT [facility])  CULTURE, GROUP A STREP [facility])    EKG  EKG Interpretation None       Radiology No results found.  Procedures Procedures (including critical care time)  Medications Ordered in UC Medications - No data to display   Initial Impression / Assessment and Plan / UC Course  I have reviewed the triage vital signs and the nursing notes.  Pertinent labs & imaging results that were available during my care of the patient were reviewed by me and considered in my medical decision  making (see chart for details).       Final Clinical Impressions(s) / UC Diagnoses   Final diagnoses:  Acute maxillary sinusitis, recurrence not specified    ED Discharge Orders        Ordered    amoxicillin (AMOXIL) 875 MG tablet  2 times daily     11/18/17 1649     1. diagnosis reviewed with patient 2. rx as per orders above; reviewed possible side effects, interactions, risks and benefits  3. Recommend supportive treatment with otc flonase  4. Follow-up prn if symptoms worsen or don't improve   Controlled Substance Prescriptions [place] Controlled Substance Registry  consulted? Not Applicable   Payton Mccallum, MD 11/18/17 [phone]

## 2017-11-18 NOTE — ED Triage Notes (Signed)
Patient c/o cough, sore throat and congestion x 10 days.

## 2017-11-21 ENCOUNTER — Telehealth: Payer: Self-pay | Admitting: Emergency Medicine

## 2017-11-21 LAB — CULTURE, GROUP A STREP (THRC)

## 2017-11-21 NOTE — Telephone Encounter (Signed)
Called patient to follow-up with patient regarding recent visit at [facility].  Patient instructed to call back with any questions or concerns. H. Anise Harbin RN 

## 2018-01-16 ENCOUNTER — Encounter: Payer: Self-pay | Admitting: Urology

## 2018-01-16 ENCOUNTER — Ambulatory Visit: Payer: 59 | Admitting: Urology

## 2018-01-16 VITALS — BP 156/83 | HR 60 | Resp 16 | Ht 75.0 in | Wt 256.7 lb

## 2018-01-16 DIAGNOSIS — R3129 Other microscopic hematuria: Secondary | ICD-10-CM | POA: Diagnosis not present

## 2018-01-16 DIAGNOSIS — R310 Gross hematuria: Secondary | ICD-10-CM

## 2018-01-16 LAB — MICROSCOPIC EXAMINATION: RBC, UA: >30 /HPF — AB (ref 0–2)

## 2018-01-16 LAB — URINALYSIS, COMPLETE
BILIRUBIN UA: NEGATIVE
Glucose, UA: NEGATIVE
Ketones, UA: NEGATIVE
Leukocytes, UA: NEGATIVE
NITRITE UA: NEGATIVE
PROTEIN UA: NEGATIVE
Specific Gravity, UA: 1.02 (ref 1.005–1.030)
UUROB: 0.2 mg/dL (ref 0.2–1.0)
pH, UA: 5.5 (ref 5.0–7.5)

## 2018-01-16 NOTE — Progress Notes (Signed)
01/16/2018 8:54 AM   Tom Bauer 08/29/1956 [id number]  Referring provider: Dortha KernBliss, Laura K, MD [address] [place], [place] [id number]  Chief Complaint  Patient presents with  . Hematuria    New patient    HPI: Tom Bauer is a 62 year old male seen at the request of Dr. Dareen PianoAnderson for evaluation of hematuria.  In mid March 2019 he had onset of total gross painless hematuria.  He states his urine was initially pink tinged and then became darker red without clots.  He denied dysuria, fever or chills.  He does have baseline urinary frequency, urgency and intermittent urinary stream.  He has had occasional dull bilateral back pain.  His hematuria resolved after 24 hours.  [facility].  A CT showed bilateral nephrolithiasis.  He had had a stone analysis showing 88% uric acid calculi.  A 24 urine study was recommended however apparently was never performed.   PMH: Past Medical History:  Diagnosis Date  . Hypertension   . Reflux   . Seizures (HCC)     Surgical History: No past surgical history on file.  Home Medications:  Allergies as of 01/16/2018   No Known Allergies     Medication List        Accurate as of 01/16/18  8:54 AM. Always use your most recent med list.          losartan-hydrochlorothiazide 100-12.5 MG tablet Commonly known as:  HYZAAR Take 1 tablet by mouth daily.   Oxcarbazepine 300 MG tablet Commonly known as:  TRILEPTAL Take 900 mg by mouth 2 (two) times daily.   torsemide 5 MG tablet Commonly known as:  DEMADEX Take 5 mg by mouth daily.       Allergies: No Known Allergies  Family History: Family History  Problem Relation Age of Onset  . Cancer Mother   . Diabetes Mother     Social History:  reports that he has never smoked. He has never used smokeless tobacco. He reports that he does not drink alcohol or use drugs.  ROS: UROLOGY Frequent Urination?: Yes Hard to postpone urination?:  Yes Burning/pain with urination?: No Get up at night to urinate?: Yes Leakage of urine?: No Urine stream starts and stops?: Yes Trouble starting stream?: No Do you have to strain to urinate?: No Blood in urine?: Yes Urinary tract infection?: No Sexually transmitted disease?: No Injury to kidneys or bladder?: No Painful intercourse?: No Weak stream?: No Erection problems?: Yes Penile pain?: No  Gastrointestinal Nausea?: No Vomiting?: No Indigestion/heartburn?: No Diarrhea?: No Constipation?: No  Constitutional Fever: No Night sweats?: No Weight loss?: No Fatigue?: No  Skin Skin rash/lesions?: No Itching?: No  Eyes Blurred vision?: No Double vision?: No  Ears/Nose/Throat Sore throat?: No Sinus problems?: No  Hematologic/Lymphatic Swollen glands?: No Easy bruising?: No  Cardiovascular Leg swelling?: Yes Chest pain?: No  Respiratory Cough?: No Shortness of breath?: No  Endocrine Excessive thirst?: No  Musculoskeletal Back pain?: No Joint pain?: No  Neurological Headaches?: No Dizziness?: No  Psychologic Depression?: No Anxiety?: No  Physical Exam: BP (!) 156/83   Pulse 60   Resp 16   Ht 6\' 3"  (1.905 m)   Wt 256 lb 11.2 oz (116.4 kg)   SpO2 98%   BMI 32.09 kg/m   Constitutional:  Alert and oriented, No acute distress. HEENT: [place] AT, moist mucus membranes.  Trachea midline, no masses. Cardiovascular: No clubbing, cyanosis, or edema. Respiratory: Normal respiratory  effort, no increased work of breathing. GI: Abdomen is soft, nontender, nondistended, no abdominal masses GU: No CVA tenderness Lymph: No cervical or inguinal lymphadenopathy. Skin: No rashes, bruises or suspicious lesions. Neurologic: Grossly intact, no focal deficits, moving all 4 extremities. Psychiatric: Normal mood and affect.  Laboratory Data: Lab Results  Component Value Date   WBC 9.1 07/02/2014   HGB 16.6 07/02/2014   HCT 49.3 07/02/2014   MCV 96 07/02/2014    PLT 118 (L) 07/02/2014    Lab Results  Component Value Date   CREATININE 1.58 (H) 07/02/2014   Urinalysis Dipstick: 2+ blood Microscopy: >30 RBC; 6-10 WBC  Assessment & Plan:   62 year old male with a history of stone disease and recent total gross painless hematuria.  I have recommended proceeding with a full hematuria evaluation to include CT urogram and cystoscopy.  He is in agreement and desires to proceed.    Riki Altes, MD  [facility] [address] [place], [place] [address] [phone]

## 2018-01-17 ENCOUNTER — Ambulatory Visit: Payer: Self-pay | Admitting: Urology

## 2018-02-08 ENCOUNTER — Ambulatory Visit
Admission: RE | Admit: 2018-02-08 | Discharge: 2018-02-08 | Disposition: A | Discharge status: Home / Self Care | Payer: 59 | Source: Ambulatory Visit | Attending: Urology | Admitting: Urology

## 2018-02-08 DIAGNOSIS — R1905 Periumbilic swelling, mass or lump: Secondary | ICD-10-CM | POA: Diagnosis not present

## 2018-02-08 DIAGNOSIS — N200 Calculus of kidney: Secondary | ICD-10-CM | POA: Insufficient documentation

## 2018-02-08 DIAGNOSIS — R310 Gross hematuria: Secondary | ICD-10-CM

## 2018-02-08 MED ORDER — IOPAMIDOL (ISOVUE-300) INJECTION 61%
125.0000 mL | Freq: Once | INTRAVENOUS | Status: AC | PRN
  Administered 2018-02-08: 125 mL via INTRAVENOUS

## 2018-02-13 ENCOUNTER — Encounter: Payer: Self-pay | Admitting: Urology

## 2018-02-13 ENCOUNTER — Ambulatory Visit: Payer: 59 | Admitting: Urology

## 2018-02-13 VITALS — BP 153/87 | HR 68 | Ht 75.0 in | Wt 259.0 lb

## 2018-02-13 DIAGNOSIS — R310 Gross hematuria: Secondary | ICD-10-CM | POA: Diagnosis not present

## 2018-02-13 DIAGNOSIS — R222 Localized swelling, mass and lump, trunk: Secondary | ICD-10-CM

## 2018-02-13 DIAGNOSIS — R3129 Other microscopic hematuria: Secondary | ICD-10-CM

## 2018-02-13 LAB — URINALYSIS, COMPLETE
Bilirubin, UA: NEGATIVE
Glucose, UA: NEGATIVE
Ketones, UA: NEGATIVE
Leukocytes, UA: NEGATIVE
Nitrite, UA: NEGATIVE
PH UA: 5 (ref 5.0–7.5)
Specific Gravity, UA: 1.02 (ref 1.005–1.030)
Urobilinogen, Ur: 0.2 mg/dL (ref 0.2–1.0)

## 2018-02-13 LAB — MICROSCOPIC EXAMINATION: WBC, UA: NONE SEEN /hpf (ref 0–5)

## 2018-02-14 NOTE — Progress Notes (Signed)
   02/14/18  CC:  Chief Complaint  Patient presents with  . Cysto    HPI: 62 year old male recently seen for recurrent gross hematuria.  Refer to prior note of 01/16/2018 for details.  CT urogram performed on 02/08/2018 showed bilateral nonobstructing renal calculi the largest measuring 2 cm in the upper pole collecting system of the left kidney.  No renal mass, ureteral calculi or hydronephrosis was seen.  There was incidentally noted to be a 2.6 cm enhancing soft tissue mass in the anterior abdominal wall at the umbilicus which was stable from a prior CT of 2015 and felt to be benign.  Blood pressure (!) 153/87, pulse 68, height  (1.905 m), weight 259 lb (117.5 kg), SpO2 99 %. NED. A&Ox3.   No respiratory distress   Abd soft, NT, ND Normal phallus with bilateral descended testicles  Cystoscopy Procedure Note  Patient identification was confirmed, informed consent was obtained, and patient was prepped using Betadine solution.  Lidocaine jelly was administered per urethral meatus.    Preoperative abx where received prior to procedure.     Pre-Procedure: - Inspection reveals a normal caliber ureteral meatus.  Procedure: The flexible cystoscope was introduced without difficulty - No urethral strictures/lesions are present. - Moderate lateral lobe enlargement prostate  - Moderate bladder neck elevation -Upon introduction of the cystoscope and the bladder vision was suboptimal due to blood-tinged urine.  60 mL of slightly bloody urine was aspirated and vision was better. - Bilateral ureteral orifices identified - Bladder mucosa  reveals no ulcers, tumors, or lesions - No bladder stones - No trabeculation  Retroflexion shows no significant abnormalities   Post-Procedure: - Patient tolerated the procedure well  Assessment/ Plan: No significant lower tract pathology identified on cystoscopy.  He has moderate prostate enlargement.  He does have a 2 cm nonobstructing left renal  calculus which is most likely his hematuria source.  He has a history of uric acid nephrolithiasis.  Will proceed with a metabolic evaluation through [facility].  Based on the large size of the stone dissolution therapy may not be effective.  We will also refer to general surgery regarding the abdominal wall soft tissue neoplasm.

## 2018-02-16 LAB — CULTURE, URINE COMPREHENSIVE

## 2018-02-18 ENCOUNTER — Telehealth: Payer: Self-pay

## 2018-02-18 NOTE — Telephone Encounter (Signed)
Called pt, daughter answers (listed on DPR) informed daughter of neg cx results. She gave verbal understanding.

## 2018-02-19 ENCOUNTER — Other Ambulatory Visit: Payer: Self-pay | Admitting: Urology

## 2018-02-20 ENCOUNTER — Telehealth: Payer: Self-pay

## 2018-02-20 NOTE — Telephone Encounter (Signed)
-----   Message from Riki Altes, MD sent at 02/20/2018  2:33 PM EDT ----- Urine cytology negative for malignant or abnormal appearing cells

## 2018-02-20 NOTE — Telephone Encounter (Signed)
Pt informed

## 2018-02-28 ENCOUNTER — Telehealth: Payer: Self-pay | Admitting: Urology

## 2018-02-28 NOTE — Telephone Encounter (Signed)
Did you ever get these?  Tom Bauer

## 2018-02-28 NOTE — Telephone Encounter (Signed)
-----   Message from Riki Altes, MD sent at 02/21/2018 10:32 AM EDT ----- Patient had a 24-hour urine study done at [facility] that I ordered from [facility] 2 to 3 years ago.  Can you see if we can get a copy of that report?  Thanks

## 2018-03-01 NOTE — Telephone Encounter (Signed)
I have not seen them

## 2018-03-08 ENCOUNTER — Encounter: Payer: Self-pay | Admitting: Surgery

## 2018-03-08 ENCOUNTER — Ambulatory Visit: Payer: 59 | Admitting: Surgery

## 2018-03-08 VITALS — BP 156/83 | HR 65 | Temp 98.0°F | Ht 75.0 in | Wt 258.0 lb

## 2018-03-08 DIAGNOSIS — K429 Umbilical hernia without obstruction or gangrene: Secondary | ICD-10-CM | POA: Diagnosis not present

## 2018-03-08 NOTE — Patient Instructions (Signed)
We have your surgery scheduled for 04/19/18 at [facility] with [facility].   Please see your blue pre-care sheet for surgery information.    Umbilical Hernia, Adult A hernia is a bulge of tissue that pushes through an opening between muscles. An umbilical hernia happens in the abdomen, near the belly button (umbilicus). The hernia may contain tissues from the small intestine, large intestine, or fatty tissue covering the intestines (omentum). Umbilical hernias in adults tend to get worse over time, and they require surgical treatment. There are several types of umbilical hernias. You may have:  A hernia located just above or below the umbilicus (indirect hernia). This is the most common type of umbilical hernia in adults.  A hernia that forms through an opening formed by the umbilicus (direct hernia).  A hernia that comes and goes (reducible hernia). A reducible hernia may be visible only when you strain, lift something heavy, or cough. This type of hernia can be pushed back into the abdomen (reduced).  A hernia that traps abdominal tissue inside the hernia (incarcerated hernia). This type of hernia cannot be reduced.  A hernia that cuts off blood flow to the tissues inside the hernia (strangulated hernia). The tissues can start to die if this happens. This type of hernia requires emergency treatment.  What are the causes? An umbilical hernia happens when tissue inside the abdomen presses on a weak area of the abdominal muscles. What increases the risk? You may have a greater risk of this condition if you:  Are obese.  Have had several pregnancies.  Have a buildup of fluid inside your abdomen (ascites).  Have had surgery that weakens the abdominal muscles.  What are the signs or symptoms? The main symptom of this condition is a painless bulge at or near the belly button. A reducible hernia may be visible only when you strain, lift something heavy, or cough. Other symptoms  may include:  Dull pain.  A feeling of pressure.  Symptoms of a strangulated hernia may include:  Pain that gets increasingly worse.  Nausea and vomiting.  Pain when pressing on the hernia.  Skin over the hernia becoming red or purple.  Constipation.  Blood in the stool.  How is this diagnosed? This condition may be diagnosed based on:  A physical exam. You may be asked to cough or strain while standing. These actions increase the pressure inside your abdomen and force the hernia through the opening in your muscles. Your health care provider may try to reduce the hernia by pressing on it.  Your symptoms and medical history.  How is this treated? Surgery is the only treatment for an umbilical hernia. Surgery for a strangulated hernia is done as soon as possible. If you have a small hernia that is not incarcerated, you may need to lose weight before having surgery. Follow these instructions at home:  Lose weight, if told by your health care provider.  Do not try to push the hernia back in.  Watch your hernia for any changes in color or size. Tell your health care provider if any changes occur.  You may need to avoid activities that increase pressure on your hernia.  Do not lift anything that is heavier than 10 lb (4.5 kg) until your health care provider says that this is safe.  Take over-the-counter and prescription medicines only as told by your health care provider.  Keep all follow-up visits as told by your health care provider. This is important. Contact a  health care provider if:  Your hernia gets larger.  Your hernia becomes painful. Get help right away if:  You develop sudden, severe pain near the area of your hernia.  You have pain as well as nausea or vomiting.  You have pain and the skin over your hernia changes color.  You develop a fever. This information is not intended to replace advice given to you by your health care provider. Make sure you  discuss any questions you have with your health care provider. Document Released: 02/25/2016 Document Revised: 05/28/2016 Document Reviewed: 02/25/2016 Elsevier Interactive Patient Education  [employer].

## 2018-03-08 NOTE — Progress Notes (Signed)
Surgical Clinic History and Physical  Referring provider:  Dortha Kern, MD [address] [place], [place] [address]  HISTORY OF PRESENT ILLNESS (HPI):  62 y.o. male presents for evaluation of his umbilical hernia. Patient reports umbilical bulge has been present x "a few years", but only recently has it began to cause patient discomfort with increasing pain when lifting or straining, though patient denies routine constipation, straining with urination, or frequent coughing. He recently also underwent workup for hematuria, including cystoscopy and CT ordered by urology (Dr. Lonna Cobb), which visualized nephrolithiasis and umbilical soft tissue mass unchanged since prior abdominal CT for same (2015). Patient otherwise reports the bulge becomes less noticeable and less painful when he is laying down, and he denies any abdominal distention, abdominal pain otherwise, unintentional weight loss, or fever/chills and states he is able to walk >1 - 2 blocks or up/down a flight of steps without CP or SOB.  PAST MEDICAL HISTORY (PMH):  Past Medical History:  Diagnosis Date  . Contusion of lower leg 03/10/2016  . GERD (gastroesophageal reflux disease) 11/13/2017   Last Assessment & Plan:  Heartburn is controlled  . HTN, goal below 140/90 11/13/2017   Last Assessment & Plan:  Taking medications without noted side effects or dizziness.    . Hypertension   . Reflux   . Renal cysts, acquired, bilateral 07/31/2014  . Seizures (HCC)   . Strain of calf muscle 03/10/2016     PAST SURGICAL HISTORY (PSH):  History reviewed. No pertinent surgical history.   MEDICATIONS:  Prior to Admission medications   Medication Sig Start Date End Date Taking? Authorizing Provider  losartan (COZAAR) 100 MG tablet Take 100 mg by mouth daily.  01/21/18  Yes [provider]  Oxcarbazepine (TRILEPTAL) 300 MG tablet Take 900 mg by mouth 2 (two) times daily.   Yes [provider]  torsemide (DEMADEX) 5 MG tablet Take  5 mg by mouth daily.   Yes [provider]     ALLERGIES:  No Known Allergies   SOCIAL HISTORY:  Social History   Socioeconomic History  . Marital status: Married    Spouse name: Not on file  . Number of children: Not on file  . Years of education: Not on file  . Highest education level: Not on file  Occupational History  . Not on file  Social Needs  . Financial resource strain: Not on file  . Food insecurity:    Worry: Not on file    Inability: Not on file  . Transportation needs:    Medical: Not on file    Non-medical: Not on file  Tobacco Use  . Smoking status: Never Smoker  . Smokeless tobacco: Never Used  Substance and Sexual Activity  . Alcohol use: No  . Drug use: No  . Sexual activity: Not Currently  Lifestyle  . Physical activity:    Days per week: Not on file    Minutes per session: Not on file  . Stress: Not on file  Relationships  . Social connections:    Talks on phone: Not on file    Gets together: Not on file    Attends religious service: Not on file    Active member of club or organization: Not on file    Attends meetings of clubs or organizations: Not on file    Relationship status: Not on file  . Intimate partner violence:    Fear of current or ex partner: Not on file  Emotionally abused: Not on file    Physically abused: Not on file    Forced sexual activity: Not on file  Other Topics Concern  . Not on file  Social History Narrative  . Not on file    The patient currently resides (home / rehab facility / nursing home): Home The patient normally is (ambulatory / bedbound): Ambulatory  FAMILY HISTORY:  Family History  Problem Relation Age of Onset  . Cancer Mother   . Diabetes Mother     Otherwise negative/non-contributory.  REVIEW OF SYSTEMS:  Constitutional: denies any other weight loss, fever, chills, or sweats  Eyes: denies any other vision changes, history of eye injury  ENT: denies sore throat, hearing problems   Respiratory: denies shortness of breath, wheezing  Cardiovascular: denies chest pain, palpitations  Gastrointestinal: abdominal pain, N/V, and bowel function as per HPI Musculoskeletal: denies any other joint pains or cramps  Skin: Denies any other rashes or skin discolorations Neurological: denies any other headache, dizziness, weakness  Psychiatric: Denies any other depression, anxiety   All other review of systems were otherwise negative   VITAL SIGNS:  BP (!) 156/83   Pulse 65   Temp 98 F (36.7 C) (Oral)   Ht  (1.905 m)   Wt 258 lb (117 kg)   BMI 32.25 kg/m   PHYSICAL EXAM:  Constitutional:  -- Normal body habitus  -- Awake, alert, and oriented x3  Eyes:  -- Pupils equally round and reactive to light  -- No scleral icterus  Ear, nose, throat:  -- No jugular venous distension -- No nasal drainage, bleeding Pulmonary:  -- No crackles  -- Equal breath sounds bilaterally -- Breathing non-labored at rest Cardiovascular:  -- S1, S2 present  -- No pericardial rubs  Gastrointestinal:  -- Abdomen soft, nontender, non-distended, no guarding/rebound  -- Mildly tender easily reducible rather large defect umbilical hernia with no additional soft tissue mass appreciated on exam -- No other abdominal masses appreciated, pulsatile or otherwise  Musculoskeletal and Integumentary:  -- Wounds or skin discoloration: None appreciated -- Extremities: B/L UE and LE FROM, hands and feet warm, no edema  Neurologic:  -- Motor function: Intact and symmetric -- Sensation: Intact and symmetric  Labs:  CBC Latest Ref Rng & Units 07/02/2014  WBC 3.8 - 10.6 x10 3/mm 3 9.1  Hemoglobin 13.0 - 18.0 g/dL 98.1  Hematocrit 19.1 - 52.0 % 49.3  Platelets 150 - 440 x10 3/mm 3 118(L)   CMP Latest Ref Rng & Units 07/02/2014  Glucose 65 - 99 mg/dL 478(G)  BUN 7 - 18 mg/dL 14  Creatinine 9.56 - 2.13 mg/dL 0.86(V)  Sodium 784 - 696 mmol/L 141  Potassium 3.5 - 5.1 mmol/L 4.0  Chloride 98 -  107 mmol/L 103  CO2 21 - 32 mmol/L 31  Calcium 8.5 - 10.1 mg/dL 8.9  Total Protein 6.4 - 8.2 g/dL 7.3  Total Bilirubin 0.2 - 1.0 mg/dL 0.6  Alkaline Phos Unit/L 139(H)  AST 15 - 37 Unit/L 26  ALT U/L 62    Imaging studies:  CT Abdomen and Pelvis with Contrast (02/08/2018) - personally reviewed, compared with report from 07/02/2014, and discussed with patient and his wife Bilateral nonobstructing renal calculi, largest measuring 2.3 cm in the upper pole collecting system of the left kidney. 3 mm calculus also noted in left renal pelvis. No evidence of ureteral calculi or hydronephrosis.  No radiographic evidence of urinary tract neoplasm.  2.6 cm enhancing soft tissue mass in  the anterior abdominal wall at the umbilicus, likely due to benign soft tissue neoplasm or granulation tissue. Recommend consideration of surgical excision.   Assessment/Plan:  62 y.o. male with an increasingly symptomatic reducible umbilical hernia, complicated by co-morbidities including HTN, hematuria attributed to nephrolithiasis, CKD with B/L renal cysts, GERD, and a history of seizure disorder.   - CT results discussed with patient and his wife  - reviewed signs/symptoms of incarceration and obstruction with return instructions  - maintain hydration and high-fiber diet to reduce constipation, minimize heavy lifting, consider abdominal binder prior to (and after) surgical hernia repair  - all risks, benefits, and alternatives to elective open repair of increasingly symptomatic (painful) umbilical hernia with mesh and possible excision of abdominal wall mass were discussed with the patient and his wife, all of their questions were answered to their expressed satisfaction, patient expresses he wishes to proceed, and informed consent was obtained accordingly.   - will plan for open repair of increasingly symptomatic umbilical hernia with mesh and possible excision of abdominal wall mass on 04/19/2018 per patient's  request   - anticipate return to clinic 2 weeks following above planned procedure  - instructed to call if any questions or concerns  All of the above recommendations were discussed with the patient and patient's family, and all of patient's and family's questions were answered to their expressed satisfaction.  Thank you for the opportunity to participate in this patient's care.  -- Scherrie Gerlach Earlene Plater, MD, RPVI [place]: [facility] General Surgery - Partnering for exceptional care. Office: [phone]

## 2018-03-08 NOTE — H&P (View-Only) (Signed)
Surgical Clinic History and Physical  Referring provider:  Bliss, Laura K, MD [address] [place], [place] [address]  HISTORY OF PRESENT ILLNESS (HPI):  Tom Bauer presents for evaluation of his umbilical hernia. Patient reports umbilical bulge has been present x "a few years", but only recently has it began to cause patient discomfort with increasing pain when lifting or straining, though patient denies routine constipation, straining with urination, or frequent coughing. He recently also underwent workup for hematuria, including cystoscopy and CT ordered by urology (Dr. Stoioff), which visualized nephrolithiasis and umbilical soft tissue mass unchanged since prior abdominal CT for same (2015). Patient otherwise reports the bulge becomes less noticeable and less painful when he is laying down, and he denies any abdominal distention, abdominal pain otherwise, unintentional weight loss, or fever/chills and states he is able to walk >1 - 2 blocks or up/down a flight of steps without CP or SOB.  PAST MEDICAL HISTORY (PMH):  Past Medical History:  Diagnosis Date  . Contusion of lower leg 03/10/2016  . GERD (gastroesophageal reflux disease) 11/13/2017   Last Assessment & Plan:  Heartburn is controlled  . HTN, goal below 140/90 11/13/2017   Last Assessment & Plan:  Taking medications without noted side effects or dizziness.    . Hypertension   . Reflux   . Renal cysts, acquired, bilateral 07/31/2014  . Seizures (HCC)   . Strain of calf muscle 03/10/2016     PAST SURGICAL HISTORY (PSH):  History reviewed. No pertinent surgical history.   MEDICATIONS:  Prior to Admission medications   Medication Sig Start Date End Date Taking? Authorizing Provider  losartan (COZAAR) 100 MG tablet Take 100 mg by mouth daily.  01/21/18  Yes [provider]  Oxcarbazepine (TRILEPTAL) 300 MG tablet Take 900 mg by mouth 2 (two) times daily.   Yes [provider]  torsemide (DEMADEX) 5 MG tablet Take  5 mg by mouth daily.   Yes [provider]     ALLERGIES:  No Known Allergies   SOCIAL HISTORY:  Social History   Socioeconomic History  . Marital status: Married    Spouse name: Not on file  . Number of children: Not on file  . Years of education: Not on file  . Highest education level: Not on file  Occupational History  . Not on file  Social Needs  . Financial resource strain: Not on file  . Food insecurity:    Worry: Not on file    Inability: Not on file  . Transportation needs:    Medical: Not on file    Non-medical: Not on file  Tobacco Use  . Smoking status: Never Smoker  . Smokeless tobacco: Never Used  Substance and Sexual Activity  . Alcohol use: No  . Drug use: No  . Sexual activity: Not Currently  Lifestyle  . Physical activity:    Days per week: Not on file    Minutes per session: Not on file  . Stress: Not on file  Relationships  . Social connections:    Talks on phone: Not on file    Gets together: Not on file    Attends religious service: Not on file    Active member of club or organization: Not on file    Attends meetings of clubs or organizations: Not on file    Relationship status: Not on file  . Intimate partner violence:    Fear of current or ex partner: Not on file      Emotionally abused: Not on file    Physically abused: Not on file    Forced sexual activity: Not on file  Other Topics Concern  . Not on file  Social History Narrative  . Not on file    The patient currently resides (home / rehab facility / nursing home): Home The patient normally is (ambulatory / bedbound): Ambulatory  FAMILY HISTORY:  Family History  Problem Relation Age of Onset  . Cancer Mother   . Diabetes Mother     Otherwise negative/non-contributory.  REVIEW OF SYSTEMS:  Constitutional: denies any other weight loss, fever, chills, or sweats  Eyes: denies any other vision changes, history of eye injury  ENT: denies sore throat, hearing problems   Respiratory: denies shortness of breath, wheezing  Cardiovascular: denies chest pain, palpitations  Gastrointestinal: abdominal pain, N/V, and bowel function as per HPI Musculoskeletal: denies any other joint pains or cramps  Skin: Denies any other rashes or skin discolorations Neurological: denies any other headache, dizziness, weakness  Psychiatric: Denies any other depression, anxiety   All other review of systems were otherwise negative   VITAL SIGNS:  BP (!) 156/83   Pulse 65   Temp 98 F (36.7 C) (Oral)   Ht 6' 3" (1.905 m)   Wt 258 lb (117 kg)   BMI 32.25 kg/m   PHYSICAL EXAM:  Constitutional:  -- Normal body habitus  -- Awake, alert, and oriented x3  Eyes:  -- Pupils equally round and reactive to light  -- No scleral icterus  Ear, nose, throat:  -- No jugular venous distension -- No nasal drainage, bleeding Pulmonary:  -- No crackles  -- Equal breath sounds bilaterally -- Breathing non-labored at rest Cardiovascular:  -- S1, S2 present  -- No pericardial rubs  Gastrointestinal:  -- Abdomen soft, nontender, non-distended, no guarding/rebound  -- Mildly tender easily reducible rather large defect umbilical hernia with no additional soft tissue mass appreciated on exam -- No other abdominal masses appreciated, pulsatile or otherwise  Musculoskeletal and Integumentary:  -- Wounds or skin discoloration: None appreciated -- Extremities: B/L UE and LE FROM, hands and feet warm, no edema  Neurologic:  -- Motor function: Intact and symmetric -- Sensation: Intact and symmetric  Labs:  CBC Latest Ref Rng & Units 07/02/2014  WBC 3.8 - 10.6 x10 3/mm 3 9.1  Hemoglobin 13.0 - 18.0 g/dL 16.6  Hematocrit 40.0 - 52.0 % 49.3  Platelets 150 - 440 x10 3/mm 3 118(L)   CMP Latest Ref Rng & Units 07/02/2014  Glucose 65 - 99 mg/dL 110(H)  BUN 7 - 18 mg/dL 14  Creatinine 0.60 - 1.30 mg/dL 1.58(H)  Sodium 136 - 145 mmol/L 141  Potassium 3.5 - 5.1 mmol/L 4.0  Chloride 98 -  107 mmol/L 103  CO2 21 - 32 mmol/L 31  Calcium 8.5 - 10.1 mg/dL 8.9  Total Protein 6.4 - 8.2 g/dL 7.3  Total Bilirubin 0.2 - 1.0 mg/dL 0.6  Alkaline Phos Unit/L 139(H)  AST 15 - 37 Unit/L 26  ALT U/L 62    Imaging studies:  CT Abdomen and Pelvis with Contrast (02/08/2018) - personally reviewed, compared with report from 07/02/2014, and discussed with patient and his wife Bilateral nonobstructing renal calculi, largest measuring 2.3 cm in the upper pole collecting system of the left kidney. 3 mm calculus also noted in left renal pelvis. No evidence of ureteral calculi or hydronephrosis.  No radiographic evidence of urinary tract neoplasm.  2.6 cm enhancing soft tissue mass in   the anterior abdominal wall at the umbilicus, likely due to benign soft tissue neoplasm or granulation tissue. Recommend consideration of surgical excision.   Assessment/Plan:  Tom Bauer with an increasingly symptomatic reducible umbilical hernia, complicated by co-morbidities including HTN, hematuria attributed to nephrolithiasis, CKD with B/L renal cysts, GERD, and a history of seizure disorder.   - CT results discussed with patient and his wife  - reviewed signs/symptoms of incarceration and obstruction with return instructions  - maintain hydration and high-fiber diet to reduce constipation, minimize heavy lifting, consider abdominal binder prior to (and after) surgical hernia repair  - all risks, benefits, and alternatives to elective open repair of increasingly symptomatic (painful) umbilical hernia with mesh and possible excision of abdominal wall mass were discussed with the patient and his wife, all of their questions were answered to their expressed satisfaction, patient expresses he wishes to proceed, and informed consent was obtained accordingly.   - will plan for open repair of increasingly symptomatic umbilical hernia with mesh and possible excision of abdominal wall mass on 04/19/2018 per patient's  request   - anticipate return to clinic 2 weeks following above planned procedure  - instructed to call if any questions or concerns  All of the above recommendations were discussed with the patient and patient's family, and all of patient's and family's questions were answered to their expressed satisfaction.  Thank you for the opportunity to participate in this patient's care.  -- Jason E. Davis, MD, RPVI [place]: [facility] General Surgery - Partnering for exceptional care. Office: [phone] 

## 2018-03-11 ENCOUNTER — Telehealth: Payer: Self-pay | Admitting: Surgery

## 2018-03-11 NOTE — Telephone Encounter (Signed)
Pt advised of pre op date/time and sx date. Sx: 03/20/18 with Dr Manfred Shirtsavis-open umbilical hernia repair with mesh.  Pre op: 03/13/18 between 1-5:00pm-phone interview.   Patient made aware to call [phone], between 1-3:00pm the day before surgery, to find out what time to arrive.

## 2018-03-13 ENCOUNTER — Other Ambulatory Visit: Payer: Self-pay

## 2018-03-13 ENCOUNTER — Encounter
Admission: RE | Admit: 2018-03-13 | Discharge: 2018-03-13 | Disposition: A | Discharge status: Home / Self Care | Payer: 59 | Source: Ambulatory Visit | Attending: Surgery | Admitting: Surgery

## 2018-03-13 HISTORY — DX: Personal history of urinary calculi: Z87.442

## 2018-03-13 NOTE — Patient Instructions (Addendum)
[facility]: [facility] 2nd floor medical mall [facility](Medical Mall Entrance-take elevator on left to 2nd floor.  Check in with surgery information desk.) To find out your arrival time please call [phone] between 1PM - 3PM on 03-19-18 TUESDAY  Remember: Instructions that are not followed completely may result in serious medical risk, up to and including death, or upon the discretion of your surgeon and anesthesiologist your surgery may need to be rescheduled.    _x___ 1. Do not eat food after midnight the night before your procedure. You may drink clear liquids up to 2 hours before you are scheduled to arrive at the hospital for your procedure.  Do not drink clear liquids within 2 hours of your scheduled arrival to the hospital.  Clear liquids include  --Water or Apple juice without pulp  --Clear carbohydrate beverage such as ClearFast or Gatorade  --Black Coffee or Clear Tea (No milk, no creamers, do not add anything to the coffee or Tea Type 1 and type 2 diabetics should only drink water.  No gum chewing or hard candies.     __x__ 2. No Alcohol for 24 hours before or after surgery.   __x__3. No Smoking or e-cigarettes for 24 prior to surgery.  Do not use any chewable tobacco products for at least 6 hour prior to surgery   ____  4. Bring all medications with you on the day of surgery if instructed.    __x__ 5. Notify your doctor if there is any change in your medical condition     (cold, fever, infections).    x___6. On the morning of surgery brush your teeth with toothpaste and water.  You may rinse your mouth with mouth wash if you wish.  Do not swallow any toothpaste or mouthwash.   Do not wear jewelry, make-up, hairpins, clips or nail polish.  Do not wear lotions, powders, or perfumes. You may wear deodorant.  Do not shave 48 hours prior to surgery. Men may shave face and neck.  Do not bring valuables to the hospital.    [facility] is not responsible for any belongings or valuables.               Contacts, dentures or bridgework may not be worn into surgery.  Leave your suitcase in the car. After surgery it may be brought to your room.  For patients admitted to the hospital, discharge time is determined by your treatment team.  _  Patients discharged the day of surgery will not be allowed to drive home.  You will need someone to drive you home and stay with you the night of your procedure.    Please read over the following fact sheets that you were given:   [facility] Preparing for Surgery and or MRSA Information   _x___ TAKE THE FOLLOWING MEDICATION THE MORNING OF SURGERY WITH A SMALL SIP OF WATER. These include:  1. TRILEPTAL  2. PRILOSEC  3. TAKE AN EXTRA PRILOSEC THE NIGHT BEFORE YOUR SURGERY  4.  5.  6.  ____Fleets enema or Magnesium Citrate as directed.   _x___ Use CHG Soap or sage wipes as directed on instruction sheet   ____ Use inhalers on the day of surgery and bring to hospital day of surgery  ____ Stop Metformin and Janumet 2 days prior to surgery.    ____ Take 1/2 of usual insulin dose the night before surgery and none on the morning surgery.  ____ Follow recommendations from Cardiologist, Pulmonologist or PCP regarding stopping Aspirin, Coumadin, Plavix ,Eliquis, Effient, or Pradaxa, and Pletal.  X____Stop Anti-inflammatories such as Advil, Aleve, Ibuprofen, Motrin, Naproxen, Naprosyn, Goodies powders or aspirin products NOW-OK to take Tylenol    ____ Stop supplements until after surgery.     ____ Bring C-Pap to the hospital.

## 2018-03-13 NOTE — Pre-Procedure Instructions (Signed)
ECG 12-lead2/02/2018 [facility] Component Name Value Ref Range  Vent Rate (bpm) 73   PR Interval (msec) 148   QRS Interval (msec) 102   QT Interval (msec) 410   QTc (msec) 451   Other Result Information  This result has an attachment that is not available.  Result Narrative  Sinus rhythm with premature atrial complexes Left axis deviation Abnormal ECG When compared with ECG of 10-Jul-1993 14:18, premature atrial complexes are now present I reviewed and concur with this report. Electronically signed [email or web address]:ANDERSON MD, MARSHALL [phone]) on 11/21/2017 12:46:28 PM  Status Results Details   Encounter Summary

## 2018-03-18 ENCOUNTER — Encounter
Admission: RE | Admit: 2018-03-18 | Discharge: 2018-03-18 | Disposition: A | Discharge status: Home / Self Care | Payer: 59 | Source: Ambulatory Visit | Attending: Surgery | Admitting: Surgery

## 2018-03-18 DIAGNOSIS — R222 Localized swelling, mass and lump, trunk: Secondary | ICD-10-CM | POA: Diagnosis not present

## 2018-03-18 DIAGNOSIS — I491 Atrial premature depolarization: Secondary | ICD-10-CM | POA: Diagnosis not present

## 2018-03-18 DIAGNOSIS — Z79899 Other long term (current) drug therapy: Secondary | ICD-10-CM | POA: Diagnosis not present

## 2018-03-18 DIAGNOSIS — Z809 Family history of malignant neoplasm, unspecified: Secondary | ICD-10-CM | POA: Diagnosis not present

## 2018-03-18 DIAGNOSIS — R569 Unspecified convulsions: Secondary | ICD-10-CM | POA: Diagnosis not present

## 2018-03-18 DIAGNOSIS — K219 Gastro-esophageal reflux disease without esophagitis: Secondary | ICD-10-CM | POA: Diagnosis not present

## 2018-03-18 DIAGNOSIS — K429 Umbilical hernia without obstruction or gangrene: Secondary | ICD-10-CM | POA: Diagnosis present

## 2018-03-18 DIAGNOSIS — N281 Cyst of kidney, acquired: Secondary | ICD-10-CM | POA: Diagnosis not present

## 2018-03-18 DIAGNOSIS — Z87442 Personal history of urinary calculi: Secondary | ICD-10-CM | POA: Diagnosis not present

## 2018-03-18 DIAGNOSIS — I10 Essential (primary) hypertension: Secondary | ICD-10-CM | POA: Diagnosis not present

## 2018-03-18 DIAGNOSIS — Z833 Family history of diabetes mellitus: Secondary | ICD-10-CM | POA: Diagnosis not present

## 2018-03-18 LAB — CBC WITH DIFFERENTIAL/PLATELET
BASOS PCT: 1 %
Basophils Absolute: 0 10*3/uL (ref 0–0.1)
EOS ABS: 0.1 10*3/uL (ref 0–0.7)
EOS PCT: 2 %
HCT: 43.9 % (ref 40.0–52.0)
Hemoglobin: 15 g/dL (ref 13.0–18.0)
LYMPHS ABS: 1.5 10*3/uL (ref 1.0–3.6)
Lymphocytes Relative: 26 %
MCH: 32.4 pg (ref 26.0–34.0)
MCHC: 34.2 g/dL (ref 32.0–36.0)
MCV: 95 fL (ref 80.0–100.0)
MONO ABS: 0.6 10*3/uL (ref 0.2–1.0)
Monocytes Relative: 10 %
Neutro Abs: 3.6 10*3/uL (ref 1.4–6.5)
Neutrophils Relative %: 61 %
PLATELETS: 118 10*3/uL — AB (ref 150–440)
RBC: 4.62 MIL/uL (ref 4.40–5.90)
RDW: 14.2 % (ref 11.5–14.5)
WBC: 5.8 10*3/uL (ref 3.8–10.6)

## 2018-03-18 LAB — COMPREHENSIVE METABOLIC PANEL
ALBUMIN: 3.7 g/dL (ref 3.5–5.0)
ALK PHOS: 93 U/L (ref 38–126)
ALT: 43 U/L (ref 17–63)
ANION GAP: 5 (ref 5–15)
AST: 28 U/L (ref 15–41)
BUN: 23 mg/dL — ABNORMAL HIGH (ref 6–20)
CALCIUM: 8.6 mg/dL — AB (ref 8.9–10.3)
CHLORIDE: 109 mmol/L (ref 101–111)
CO2: 26 mmol/L (ref 22–32)
Creatinine, Ser: 1.08 mg/dL (ref 0.61–1.24)
GFR calc non Af Amer: >60 mL/min (ref >60)
Glucose, Bld: 100 mg/dL — ABNORMAL HIGH (ref 65–99)
POTASSIUM: 3.4 mmol/L — AB (ref 3.5–5.1)
SODIUM: 140 mmol/L (ref 135–145)
Total Bilirubin: 0.7 mg/dL (ref 0.3–1.2)
Total Protein: 6.4 g/dL — ABNORMAL LOW (ref 6.5–8.1)

## 2018-03-19 ENCOUNTER — Encounter: Payer: Self-pay | Admitting: *Deleted

## 2018-03-19 MED ORDER — CEFAZOLIN SODIUM-DEXTROSE 2-4 GM/100ML-% IV SOLN
2.0000 g | INTRAVENOUS | Status: AC
  Administered 2018-03-20: 2 g via INTRAVENOUS

## 2018-03-20 ENCOUNTER — Ambulatory Visit
Admission: RE | Admit: 2018-03-20 | Discharge: 2018-03-20 | Disposition: A | Discharge status: Home / Self Care | Payer: 59 | Source: Ambulatory Visit | Attending: Surgery | Admitting: Surgery

## 2018-03-20 ENCOUNTER — Encounter
Admission: RE | Disposition: A | Discharge status: Home / Self Care | Payer: Self-pay | Source: Ambulatory Visit | Attending: Surgery

## 2018-03-20 ENCOUNTER — Other Ambulatory Visit: Payer: Self-pay

## 2018-03-20 ENCOUNTER — Ambulatory Visit: Payer: 59 | Admitting: Anesthesiology

## 2018-03-20 ENCOUNTER — Encounter: Payer: Self-pay | Admitting: *Deleted

## 2018-03-20 DIAGNOSIS — R222 Localized swelling, mass and lump, trunk: Secondary | ICD-10-CM | POA: Insufficient documentation

## 2018-03-20 DIAGNOSIS — K429 Umbilical hernia without obstruction or gangrene: Secondary | ICD-10-CM | POA: Diagnosis not present

## 2018-03-20 DIAGNOSIS — I491 Atrial premature depolarization: Secondary | ICD-10-CM | POA: Insufficient documentation

## 2018-03-20 DIAGNOSIS — Z87442 Personal history of urinary calculi: Secondary | ICD-10-CM | POA: Insufficient documentation

## 2018-03-20 DIAGNOSIS — N281 Cyst of kidney, acquired: Secondary | ICD-10-CM | POA: Insufficient documentation

## 2018-03-20 DIAGNOSIS — K219 Gastro-esophageal reflux disease without esophagitis: Secondary | ICD-10-CM | POA: Insufficient documentation

## 2018-03-20 DIAGNOSIS — Z809 Family history of malignant neoplasm, unspecified: Secondary | ICD-10-CM | POA: Insufficient documentation

## 2018-03-20 DIAGNOSIS — I10 Essential (primary) hypertension: Secondary | ICD-10-CM | POA: Insufficient documentation

## 2018-03-20 DIAGNOSIS — Z833 Family history of diabetes mellitus: Secondary | ICD-10-CM | POA: Insufficient documentation

## 2018-03-20 DIAGNOSIS — R569 Unspecified convulsions: Secondary | ICD-10-CM | POA: Insufficient documentation

## 2018-03-20 DIAGNOSIS — Z79899 Other long term (current) drug therapy: Secondary | ICD-10-CM | POA: Insufficient documentation

## 2018-03-20 HISTORY — DX: Atrial premature depolarization: I49.1

## 2018-03-20 HISTORY — PX: UMBILICAL HERNIA REPAIR: SHX196

## 2018-03-20 HISTORY — PX: INSERTION OF MESH: SHX5868

## 2018-03-20 SURGERY — REPAIR, HERNIA, UMBILICAL, ADULT
Anesthesia: General | Wound class: "Clean "

## 2018-03-20 MED ORDER — LIDOCAINE HCL 1 % IJ SOLN
INTRAMUSCULAR | Status: DC | PRN
  Administered 2018-03-20: 4 mL
  Administered 2018-03-20: 16 mL

## 2018-03-20 MED ORDER — GLYCOPYRROLATE 0.2 MG/ML IJ SOLN
INTRAMUSCULAR | Status: DC | PRN
  Administered 2018-03-20: 0.2 mg via INTRAVENOUS

## 2018-03-20 MED ORDER — ACETAMINOPHEN 500 MG PO TABS
ORAL_TABLET | ORAL | Status: AC
  Filled 2018-03-20: qty 2

## 2018-03-20 MED ORDER — GABAPENTIN 300 MG PO CAPS
ORAL_CAPSULE | ORAL | Status: AC
  Filled 2018-03-20: qty 1

## 2018-03-20 MED ORDER — ONDANSETRON HCL 4 MG/2ML IJ SOLN
INTRAMUSCULAR | Status: DC | PRN
  Administered 2018-03-20: 4 mg via INTRAVENOUS

## 2018-03-20 MED ORDER — ACETAMINOPHEN 500 MG PO TABS
1000.0000 mg | ORAL_TABLET | ORAL | Status: AC
  Administered 2018-03-20: 1000 mg via ORAL

## 2018-03-20 MED ORDER — SCOPOLAMINE 1 MG/3DAYS TD PT72
1.0000 | MEDICATED_PATCH | Freq: Once | TRANSDERMAL | Status: DC
  Administered 2018-03-20: 1.5 mg via TRANSDERMAL

## 2018-03-20 MED ORDER — MIDAZOLAM HCL 2 MG/2ML IJ SOLN
INTRAMUSCULAR | Status: AC
  Filled 2018-03-20: qty 2

## 2018-03-20 MED ORDER — FENTANYL CITRATE (PF) 100 MCG/2ML IJ SOLN
INTRAMUSCULAR | Status: AC
  Filled 2018-03-20: qty 2

## 2018-03-20 MED ORDER — FENTANYL CITRATE (PF) 100 MCG/2ML IJ SOLN
INTRAMUSCULAR | Status: DC | PRN
  Administered 2018-03-20 (×2): 50 ug via INTRAVENOUS

## 2018-03-20 MED ORDER — PHENYLEPHRINE HCL 10 MG/ML IJ SOLN
INTRAMUSCULAR | Status: DC | PRN
  Administered 2018-03-20 (×2): 100 ug via INTRAVENOUS

## 2018-03-20 MED ORDER — DEXAMETHASONE SODIUM PHOSPHATE 10 MG/ML IJ SOLN
INTRAMUSCULAR | Status: AC
  Filled 2018-03-20: qty 1

## 2018-03-20 MED ORDER — PROMETHAZINE HCL 25 MG/ML IJ SOLN: 6.2500 mg | INTRAMUSCULAR | Status: DC | PRN

## 2018-03-20 MED ORDER — MEPERIDINE HCL 50 MG/ML IJ SOLN: 6.2500 mg | INTRAMUSCULAR | Status: DC | PRN

## 2018-03-20 MED ORDER — GABAPENTIN 300 MG PO CAPS
300.0000 mg | ORAL_CAPSULE | ORAL | Status: AC
  Administered 2018-03-20: 300 mg via ORAL

## 2018-03-20 MED ORDER — BUPIVACAINE HCL (PF) 0.5 % IJ SOLN
INTRAMUSCULAR | Status: AC
  Filled 2018-03-20: qty 30

## 2018-03-20 MED ORDER — ONDANSETRON HCL 4 MG/2ML IJ SOLN
INTRAMUSCULAR | Status: AC
  Filled 2018-03-20: qty 2

## 2018-03-20 MED ORDER — LIDOCAINE HCL (PF) 1 % IJ SOLN
INTRAMUSCULAR | Status: AC
  Filled 2018-03-20: qty 30

## 2018-03-20 MED ORDER — PROPOFOL 10 MG/ML IV BOLUS
INTRAVENOUS | Status: DC | PRN
  Administered 2018-03-20: 150 mg via INTRAVENOUS

## 2018-03-20 MED ORDER — LIDOCAINE HCL (CARDIAC) PF 100 MG/5ML IV SOSY
PREFILLED_SYRINGE | INTRAVENOUS | Status: DC | PRN
  Administered 2018-03-20: 100 mg via INTRAVENOUS

## 2018-03-20 MED ORDER — MIDAZOLAM HCL 2 MG/2ML IJ SOLN
INTRAMUSCULAR | Status: DC | PRN
  Administered 2018-03-20: 2 mg via INTRAVENOUS

## 2018-03-20 MED ORDER — SCOPOLAMINE 1 MG/3DAYS TD PT72
MEDICATED_PATCH | TRANSDERMAL | Status: AC
  Administered 2018-03-20: 1.5 mg via TRANSDERMAL
  Filled 2018-03-20: qty 1

## 2018-03-20 MED ORDER — CHLORHEXIDINE GLUCONATE CLOTH 2 % EX PADS: 6.0000 | MEDICATED_PAD | Freq: Once | CUTANEOUS | Status: DC

## 2018-03-20 MED ORDER — OXYCODONE-ACETAMINOPHEN 5-325 MG PO TABS
1.0000 | ORAL_TABLET | ORAL | Status: DC | PRN
  Administered 2018-03-20: 1 via ORAL

## 2018-03-20 MED ORDER — CHLORHEXIDINE GLUCONATE CLOTH 2 % EX PADS
6.0000 | MEDICATED_PAD | Freq: Once | CUTANEOUS | Status: AC
  Administered 2018-03-20: 6 via TOPICAL

## 2018-03-20 MED ORDER — OXYCODONE-ACETAMINOPHEN 5-325 MG PO TABS
ORAL_TABLET | ORAL | Status: AC
  Filled 2018-03-20: qty 1

## 2018-03-20 MED ORDER — DEXAMETHASONE SODIUM PHOSPHATE 10 MG/ML IJ SOLN
INTRAMUSCULAR | Status: DC | PRN
  Administered 2018-03-20: 10 mg via INTRAVENOUS

## 2018-03-20 MED ORDER — OXYCODONE-ACETAMINOPHEN 5-325 MG PO TABS: 1.0000 | ORAL_TABLET | ORAL | 0 refills | Status: DC | PRN

## 2018-03-20 MED ORDER — OXYCODONE HCL 5 MG/5ML PO SOLN: 5.0000 mg | Freq: Once | ORAL | Status: DC | PRN

## 2018-03-20 MED ORDER — OXYCODONE HCL 5 MG PO TABS: 5.0000 mg | ORAL_TABLET | Freq: Once | ORAL | Status: DC | PRN

## 2018-03-20 MED ORDER — CEFAZOLIN SODIUM-DEXTROSE 2-4 GM/100ML-% IV SOLN
INTRAVENOUS | Status: AC
  Filled 2018-03-20: qty 100

## 2018-03-20 MED ORDER — HYDROMORPHONE HCL 1 MG/ML IJ SOLN: 0.2500 mg | INTRAMUSCULAR | Status: DC | PRN

## 2018-03-20 MED ORDER — LACTATED RINGERS IV SOLN
INTRAVENOUS | Status: DC
  Administered 2018-03-20: 09:00:00 via INTRAVENOUS

## 2018-03-20 SURGICAL SUPPLY — 31 items
BINDER ABDOMINAL 12 ML 46-62 (SOFTGOODS) ×2 IMPLANT
BLADE CLIPPER SURG (BLADE) ×2 IMPLANT
BLADE SURG 15 STRL LF DISP TIS (BLADE) IMPLANT
BLADE SURG 15 STRL SS (BLADE) ×2
CHLORAPREP W/TINT 26ML (MISCELLANEOUS) ×5 IMPLANT
DECANTER SPIKE VIAL GLASS SM (MISCELLANEOUS) ×2 IMPLANT
DERMABOND ADVANCED (GAUZE/BANDAGES/DRESSINGS) ×2
DERMABOND ADVANCED .7 DNX12 (GAUZE/BANDAGES/DRESSINGS) ×1 IMPLANT
DRAPE LAPAROTOMY 77X122 PED (DRAPES) ×3 IMPLANT
ELECT CAUTERY BLADE 6.4 (BLADE) ×2 IMPLANT
ELECT REM PT RETURN 9FT ADLT (ELECTROSURGICAL) ×3
ELECTRODE REM PT RTRN 9FT ADLT (ELECTROSURGICAL) ×1 IMPLANT
GLOVE BIO SURGEON STRL SZ7 (GLOVE) ×5 IMPLANT
GLOVE BIOGEL PI IND STRL 7.0 (GLOVE) IMPLANT
GLOVE BIOGEL PI IND STRL 7.5 (GLOVE) ×1 IMPLANT
GLOVE BIOGEL PI INDICATOR 7.0 (GLOVE) ×6
GLOVE BIOGEL PI INDICATOR 7.5 (GLOVE) ×2
GOWN STRL REUS W/ TWL LRG LVL3 (GOWN DISPOSABLE) ×1 IMPLANT
GOWN STRL REUS W/TWL LRG LVL3 (GOWN DISPOSABLE) ×2
KIT TURNOVER KIT A (KITS) ×3 IMPLANT
MESH VENTRALEX ST 2.5 CRC MED (Mesh General) ×2 IMPLANT
NEEDLE HYPO 22GX1.5 SAFETY (NEEDLE) ×3 IMPLANT
NS IRRIG 1000ML POUR BTL (IV SOLUTION) ×3 IMPLANT
PACK BASIN MINOR ARMC (MISCELLANEOUS) ×3 IMPLANT
SUT ETHIBOND 0 MO6 C/R (SUTURE) ×3 IMPLANT
SUT MNCRL AB 4-0 PS2 18 (SUTURE) ×3 IMPLANT
SUT VIC AB 2-0 SH 27 (SUTURE) ×2
SUT VIC AB 2-0 SH 27XBRD (SUTURE) ×1 IMPLANT
SUT VIC AB 3-0 SH 27 (SUTURE) ×2
SUT VIC AB 3-0 SH 27X BRD (SUTURE) ×1 IMPLANT
SYR 10ML LL (SYRINGE) ×3 IMPLANT

## 2018-03-20 NOTE — Discharge Instructions (Addendum)
In addition to included general post-operative instructions for Open Repair of Umbilical Hernia with Mesh,  Diet: Resume home heart healthy diet.   Activity: No heavy lifting >15 - 20 pounds (children, pets, laundry, garbage) or strenuous activity until follow-up, but light activity and walking are encouraged. Do not drive or drink alcohol if taking narcotic pain medications.  Wound care: 2 days after surgery (Friday, 6/14), you may shower/get incision wet with soapy water and pat dry (do not rub incisions), but no baths or submerging incision underwater until follow-up.   Medications: Resume all home medications. For mild to moderate pain: acetaminophen (Tylenol) or ibuprofen/naproxen (if no kidney disease). Combining Tylenol with alcohol can substantially increase your risk of causing liver disease. Narcotic pain medications, if prescribed, can be used for severe pain, though may cause nausea, constipation, and drowsiness. Once daily stool softener (Colace) can help to prevent constipation if taking narcotics. Stop Colace if you develop loose bowel movements or are no longer taking narcotic pain medications. Do not combine Tylenol and Percocet (or similar) within a 6 hour period as Percocet (and similar) contain(s) Tylenol. If you do not need the narcotic pain medication, you do not need to fill the prescription.  Call office [phone]) at any time if any questions, worsening pain, fevers/chills, bleeding, drainage from incision site, or other concerns.    AMBULATORY SURGERY  DISCHARGE INSTRUCTIONS   1) The drugs that you were given will stay in your system until tomorrow so for the next 24 hours you should not:  A) Drive an automobile B) Make any legal decisions C) Drink any alcoholic beverage   2) You may resume regular meals tomorrow.  Today it is better to start with liquids and gradually work up to solid foods.  You may eat anything you prefer, but it is better to start with  liquids, then soup and crackers, and gradually work up to solid foods.   3) Please notify your doctor immediately if you have any unusual bleeding, trouble breathing, redness and pain at the surgery site, drainage, fever, or pain not relieved by medication.    4) Additional Instructions:        Please contact your physician with any problems or Same Day Surgery at [phone], Monday through Friday 6 am to 4 pm, or [place] [facility] Main number at [phone].

## 2018-03-20 NOTE — Interval H&P Note (Signed)
History and Physical Interval Note:  03/20/2018 8:46 AM  Tom Bauer  has presented today for surgery, with the diagnosis of UMBILICAL HERNIA  The various methods of treatment have been discussed with the patient and family. After consideration of risks, benefits and other options for treatment, the patient has consented to  Procedure(s): HERNIA REPAIR UMBILICAL ADULT (N/A) INSERTION OF MESH (N/A) as a surgical intervention .  The patient's history has been reviewed, patient examined, no change in status, stable for surgery.  I have reviewed the patient's chart and labs.  Questions were answered to the patient's satisfaction.     Ancil LinseyJason Evan Marte Celani

## 2018-03-20 NOTE — Anesthesia Post-op Follow-up Note (Signed)
Anesthesia QCDR form completed.        

## 2018-03-20 NOTE — Transfer of Care (Signed)
Immediate Anesthesia Transfer of Care Note  Patient: Tom Bauer  Procedure(s) Performed: HERNIA REPAIR UMBILICAL ADULT (N/A ) INSERTION OF MESH (N/A )  Patient Location: PACU  Anesthesia Type:General  Level of Consciousness: sedated  Airway & Oxygen Therapy: Patient connected to face mask oxygen  Post-op Assessment: Post -op Vital signs reviewed and stable  Post vital signs: stable  Last Vitals:  Vitals Value Taken Time  BP 124/81 03/20/2018 11:02 AM  Temp 36.4 C 03/20/2018 11:02 AM  Pulse 57 03/20/2018 11:11 AM  Resp 13 03/20/2018 11:11 AM  SpO2 99 % 03/20/2018 11:11 AM  Vitals shown include unvalidated device data.  Last Pain:  Vitals:   03/20/18 1102  TempSrc:   PainSc: Asleep         Complications: No apparent anesthesia complications

## 2018-03-20 NOTE — Anesthesia Procedure Notes (Signed)
Procedure Name: LMA Insertion Date/Time: 03/20/2018 9:14 AM Performed by: Irving BurtonBachich, Jun Rightmyer, CRNA Pre-anesthesia Checklist: Patient identified, Emergency Drugs available, Suction available and Patient being monitored Patient Re-evaluated:Patient Re-evaluated prior to induction Oxygen Delivery Method: Circle system utilized Preoxygenation: Pre-oxygenation with 100% oxygen Induction Type: IV induction Ventilation: Mask ventilation without difficulty LMA: LMA inserted LMA Size: 4.5 Number of attempts: 1 Placement Confirmation: positive ETCO2 and breath sounds checked- equal and bilateral Tube secured with: Tape Dental Injury: Teeth and Oropharynx as per pre-operative assessment

## 2018-03-20 NOTE — Op Note (Signed)
SURGICAL OPERATIVE REPORT  DATE OF PROCEDURE: 03/20/2018  ATTENDING Surgeon(s): Ancil Linseyavis, Daaron Dimarco Evan, MD  ANESTHESIA: GETA  PRE-OPERATIVE DIAGNOSIS: Increasingly symptomatic (painful) reducible umbilical hernia (icd-10: K42.9)  POST-OPERATIVE DIAGNOSIS: Increasingly symptomatic (painful) reducible umbilical hernia (icd-10: K42.9)  PROCEDURE(S):  1.) Open repair of increasingly symptomatic (painful) umbilical hernia with mesh (cpt: [id number]) 2.) Partial omentectomy for purpose of biopsy  INTRAOPERATIVE FINDINGS: 2 cm umbilical hernia with adherent omentum and hernia sac  INTRAVENOUS FLUIDS: 800 mL crystalloid   ESTIMATED BLOOD LOSS: Minimal (<20 mL)   URINE OUTPUT: No Foley catheter  SPECIMENS: None  IMPLANTS: 6.4 cm Bard Ventralex ST ventral hernia repair mesh  DRAINS: None  COMPLICATIONS: None apparent  CONDITION AT END OF PROCEDURE: Hemodynamically stable and extubated  DISPOSITION OF PATIENT: PACU  INDICATIONS FOR PROCEDURE:  Patient is a 62 y.o. overall healthy male who presented for evaluation of his umbilical hernia. He denied abdominal distention, change in bowel function, or N/V and likewise denied constipation, straining with urination, or coughing and expressed that he'd like to have it repaired. All risks, benefits, and alternatives to open repair of umbilical hernia with mesh were discussed with the patient, all of patient's questions were answered to his expressed satisfaction, and informed consent was obtained and documented accordingly.  DETAILS OF PROCEDURE: Patient was brought to the operating suite and appropriately identified. General anesthesia was administered along with appropriate pre-operative antibiotics, and endotracheal intubation was performed by anesthetist. In supine position, operative site was prepped and draped in the usual sterile fashion, and following a brief time out, local anesthetic was injected inferior to the umbilicus, and a 3 cm  transverse curvilinear incision was made using a #15 blade scalpel and extended deep through subcutaneous tissues using blunt dissection and electrocautery. The hernia sac was then dissected from the undersurface of the umbilical skin and stalk, and a focal firm hardened segment adherent to omentum was excised and handed off the field for pathology evaluation considering pre-surgical CT which visualized umbilical soft tissue mass. The fascial defect was then cleared of all omental adhesions. A 6.4 cm Bard Ventralex ST mesh patch was selected, inserted, confirmed to approximate well to fascial edges without intervening viscera or omentum, and patch was secured without tension using Ethibond braided non-absorbable suture. The umbilicus was then invaginated using 2-0 Vicryl suture from the dermis to fascia, and the wound was irrigated using sterile saline. Overlying tissues were re-approximated in layers using buried interrupted 3-0 Vicryl suture for dermis and 4-0 Monocryl suture to re-approximate skin, which was cleaned and dried, and sterile Dermabond skin glue was applied and allowed to dry.  Patient was then safely able to be extubated, awakened, and transferred to PACU for post-operative monitoring and care.  I was present for all aspects of the above procedure, and no operative complications were apparent.

## 2018-03-20 NOTE — Anesthesia Preprocedure Evaluation (Addendum)
Anesthesia Evaluation  Patient identified by MRN, date of birth, ID band Patient awake    Reviewed: Allergy & Precautions, H&P , NPO status , reviewed documented beta blocker date and time   Airway Mallampati: II  TM Distance: <3 FB Neck ROM: full   Comment: Short jaw, may be difficult intubation if ETT required Dental  (+) Chipped, Caps   Pulmonary    Pulmonary exam normal        Cardiovascular hypertension, Normal cardiovascular exam     Neuro/Psych Seizures -,  No active seizures x years  Neuromuscular disease    GI/Hepatic GERD  Medicated and Controlled,  Endo/Other    Renal/GU Renal disease     Musculoskeletal   Abdominal   Peds  Hematology   Anesthesia Other Findings Past Medical History: 03/10/2016: Contusion of lower leg 11/13/2017: GERD (gastroesophageal reflux disease)     Comment:  Last Assessment & Plan:  Heartburn is controlled No date: History of kidney stones 11/13/2017: HTN, goal below 140/90     Comment:  Last Assessment & Plan:  Taking medications without               noted side effects or dizziness.   No date: Hypertension No date: PAC (premature atrial contraction)     Comment:  FOUND ON EKG No date: Reflux 07/31/2014: Renal cysts, acquired, bilateral 2014: Seizures (HCC) 03/10/2016: Strain of calf muscle Past Surgical History: No date: APPENDECTOMY     Comment:  AS A CHILD No date: FRACTURE SURGERY     Comment:  ARM No date: GROIN MASS OPEN BIOPSY BMI    Body Mass Index:  31.56 kg/m     Reproductive/Obstetrics                            Anesthesia Physical Anesthesia Plan  ASA: III  Anesthesia Plan: General ETT   Post-op Pain Management:    Induction: Intravenous  PONV Risk Score and Plan: 3 and Ondansetron, Treatment may vary due to age or medical condition and Midazolam  Airway Management Planned:   Additional Equipment:   Intra-op Plan:    Post-operative Plan:   Informed Consent: I have reviewed the patients History and Physical, chart, labs and discussed the procedure including the risks, benefits and alternatives for the proposed anesthesia with the patient or authorized representative who has indicated his/her understanding and acceptance.   Dental Advisory Given  Plan Discussed with: CRNA  Anesthesia Plan Comments:         Anesthesia Quick Evaluation

## 2018-03-21 LAB — SURGICAL PATHOLOGY

## 2018-03-21 NOTE — Anesthesia Postprocedure Evaluation (Signed)
Anesthesia Post Note  Patient: Gerhard PerchesClarence Roger Valladolid  Procedure(s) Performed: HERNIA REPAIR UMBILICAL ADULT (N/A ) INSERTION OF MESH (N/A )  Patient location during evaluation: PACU Anesthesia Type: General Level of consciousness: awake and alert Pain management: pain level controlled Vital Signs Assessment: post-procedure vital signs reviewed and stable Respiratory status: spontaneous breathing, nonlabored ventilation and respiratory function stable Cardiovascular status: blood pressure returned to baseline and stable Postop Assessment: no apparent nausea or vomiting Anesthetic complications: no     Last Vitals:  Vitals:   03/20/18 1205 03/20/18 1242  BP: (!) 147/85 (!) 143/79  Pulse: (!) 56 (!) 57  Resp: 18 18  Temp: (!) 35.9 C (!) 36.1 C  SpO2: 97% 97%    Last Pain:  Vitals:   03/21/18 0829  TempSrc:   PainSc: 4                  Preesha Benjamin Garry Heater Liese Dizdarevic

## 2018-04-01 ENCOUNTER — Ambulatory Visit (INDEPENDENT_AMBULATORY_CARE_PROVIDER_SITE_OTHER): Payer: 59 | Admitting: Surgery

## 2018-04-01 ENCOUNTER — Encounter: Payer: Self-pay | Admitting: Surgery

## 2018-04-01 VITALS — BP 153/82 | HR 76 | Temp 97.5°F | Ht 75.0 in | Wt 250.6 lb

## 2018-04-01 DIAGNOSIS — Z4889 Encounter for other specified surgical aftercare: Secondary | ICD-10-CM

## 2018-04-01 NOTE — Patient Instructions (Signed)

## 2018-04-01 NOTE — Progress Notes (Signed)
Surgical Clinic Progress/Follow-up Note   HPI:  62 y.o. Male presents to clinic for post-op follow-up 2 weeks s/p open repair of increasingly symptomatic (painful) umbilical hernia with mesh Earlene Plater(Lynnell Fiumara, 03/20/2018). Patient reports complete resolution of pre-operative pain with consistently improving peri-incisional pain and has been tolerating regular diet with +flatus and normal BM's, denies N/V, fever/chills, CP, or SOB. Patient describes he wore his abdominal binder with good pain relief x 1 week, but has since stopped wearing it.  Review of Systems:  Constitutional: denies fever/chills  Respiratory: denies shortness of breath, wheezing  Cardiovascular: denies chest pain, palpitations  Gastrointestinal: abdominal pain, N/V, and bowel function as per interval history Skin: Denies any other rashes or skin discolorations except post-surgical wounds as per interval history  Vital Signs:  BP (!) 153/82   Pulse 76   Temp (!) 97.5 F (36.4 C) (Oral)   Ht 6\' 3"  (1.905 m)   Wt 250 lb 9.6 oz (113.7 kg)   BMI 31.32 kg/m    Physical Exam:  Constitutional:  -- Normal body habitus  -- Awake, alert, and oriented x3  Pulmonary:  -- No crackles -- Equal breath sounds bilaterally -- Breathing non-labored at rest Cardiovascular:  -- S1, S2 present  -- No pericardial rubs  Gastrointestinal:  -- Soft and non-distended, non-tender/with mild peri-incisional tenderness to palpation, no guarding/rebound tenderness -- Post-surgical incision well-approximated without any peri-incisional erythema or drainage, small firm area immediately surrounding umbilical incision -- No abdominal masses appreciated, pulsatile or otherwise  Musculoskeletal / Integumentary:  -- Wounds or skin discoloration: None appreciated except post-surgical incisions as described above (GI) -- Extremities: B/L UE and LE FROM, hands and feet warm, no edema   Assessment:  62 y.o. yo Male with a problem list including...  Patient  Active Problem List   Diagnosis Date Noted  . GERD (gastroesophageal reflux disease) 11/13/2017  . HTN, goal below 140/90 11/13/2017  . Seizures (HCC) 11/13/2017  . Pain in lower limb 04/10/2016  . Contusion of lower leg 03/10/2016  . Strain of calf muscle 03/10/2016  . Calculus of kidney 07/31/2014  . Renal cysts, acquired, bilateral 07/31/2014    presents to clinic for post-op follow-up evaluation, doing well 2 weeks s/p open repair of increasingly symptomatic (painful) umbilical hernia with mesh Earlene Plater(Robby Pirani, 03/20/2018).  Plan:   - omental pathology results discussed             - okay to submerge incisions under water (baths, swimming) prn             - no heavy lifting >40 pounds x 1 month, after which resume activities without restrictions             - apply sunblock particularly to incisions with sun exposure to reduce pigmentation of scars             - return to clinic as needed, instructed to call office if any questions or concerns  All of the above recommendations were discussed with the patient, and all of patient's questions were answered to her expressed satisfaction.  -- Scherrie GerlachJason E. Earlene Plateravis, MD, RPVI [place]: [facility] General Surgery - Partnering for exceptional care. Office: [phone]

## 2018-04-02 ENCOUNTER — Encounter: Payer: Self-pay | Admitting: Surgery

## 2018-04-05 DIAGNOSIS — K429 Umbilical hernia without obstruction or gangrene: Secondary | ICD-10-CM

## 2020-01-02 IMAGING — CT CT ABD-PEL WO/W CM
3 of 12 series · 12 of 46 positions shown, 18 images · IV contrast (iopamidol)
Comparison: Noncontrast CT on 07/02/2014

CLINICAL DATA: Painless gross hematuria.  Nephrolithiasis.

EXAM:
CT ABDOMEN AND PELVIS WITHOUT AND WITH CONTRAST
TECHNIQUE: Multidetector CT imaging of the abdomen and pelvis was performed
following the standard protocol before and following the bolus
administration of intravenous contrast.
CONTRAST:  125mL ZUBPM1-IAA IOPAMIDOL (ZUBPM1-IAA) INJECTION 61%

[Series 9: axial with hematuria with · axial · 0.88mm/px · z∈[-1680,-1550]mm · 3 of 107 slices shown]
[im 14/107  soft-tissue]
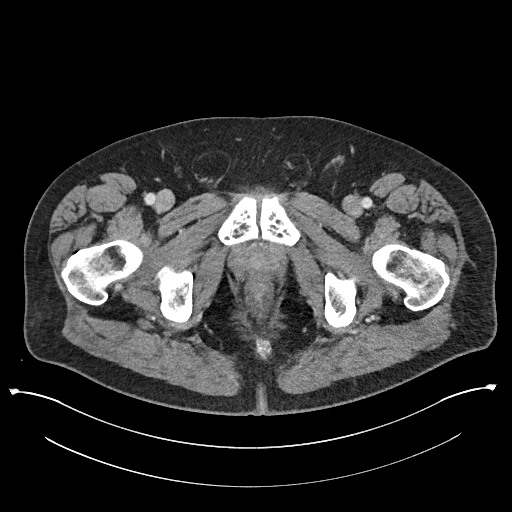
[im 27/107  soft-tissue]
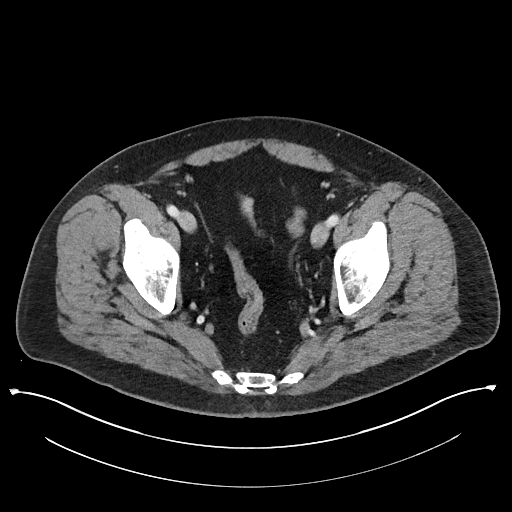
[im 40/107  soft-tissue]
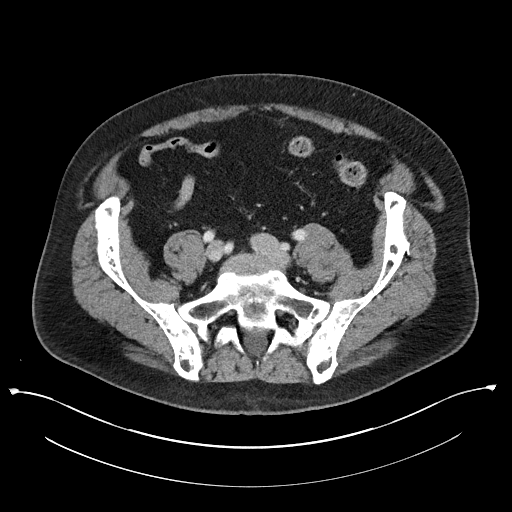

[Series 17: axial delay delay prone · axial · delayed · 0.93mm/px · z∈[-1535,-1130]mm · 7 of 109 slices shown, 12 images]
[im 14/109  soft-tissue]
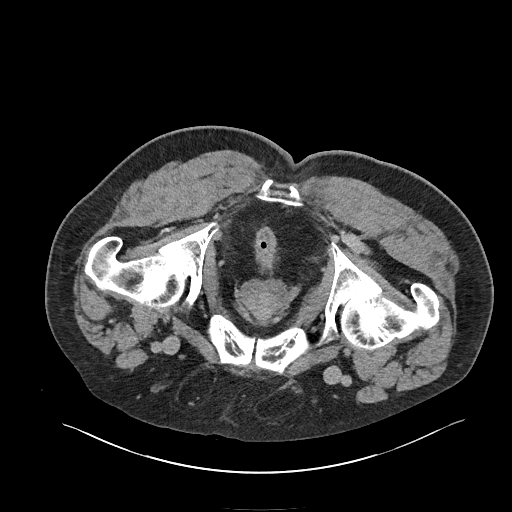
[im 14/109  bone]
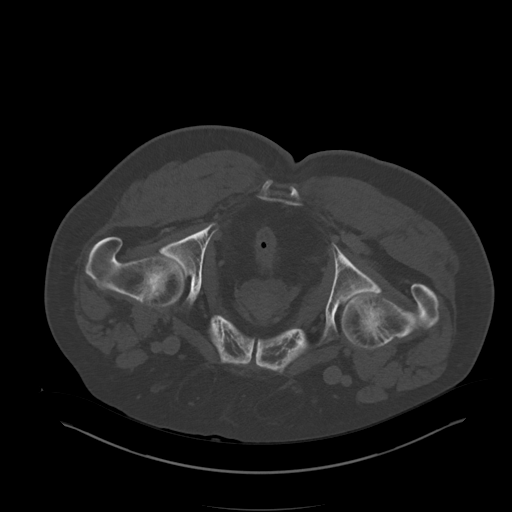
[im 28/109  soft-tissue]
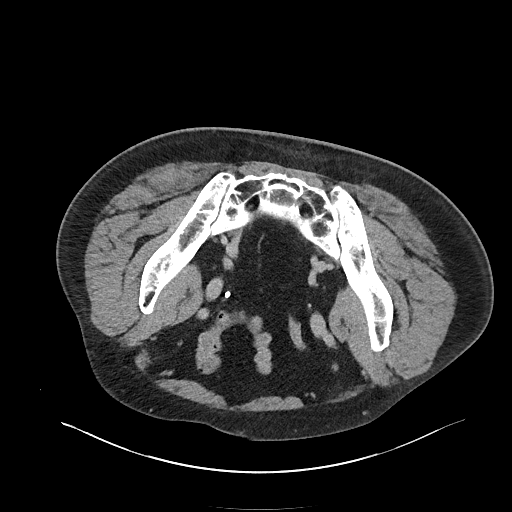
[im 41/109  soft-tissue]
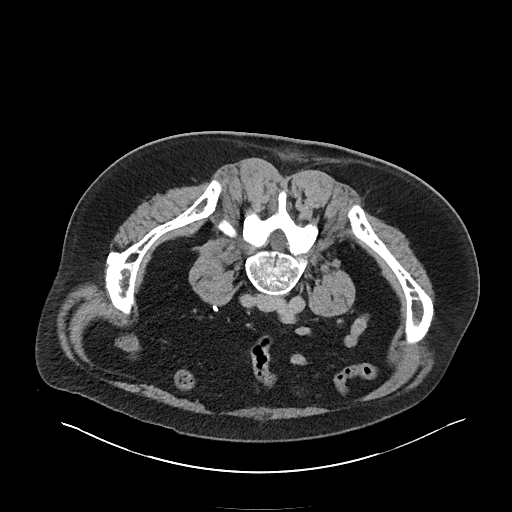
[im 55/109  soft-tissue]
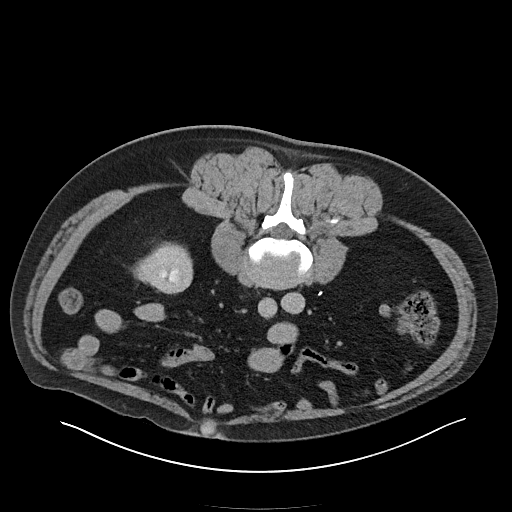
[im 55/109  lung]
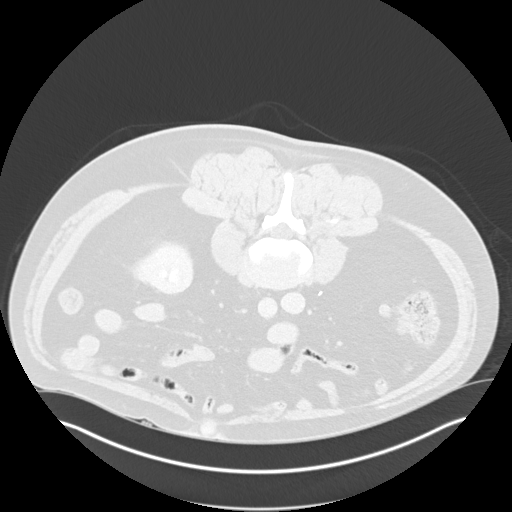
[im 68/109  soft-tissue]
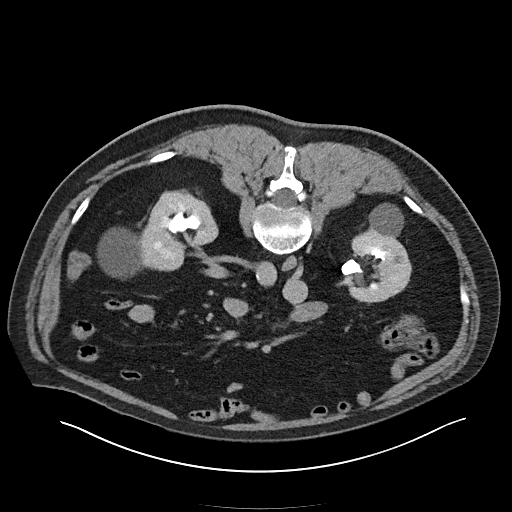
[im 68/109  lung]
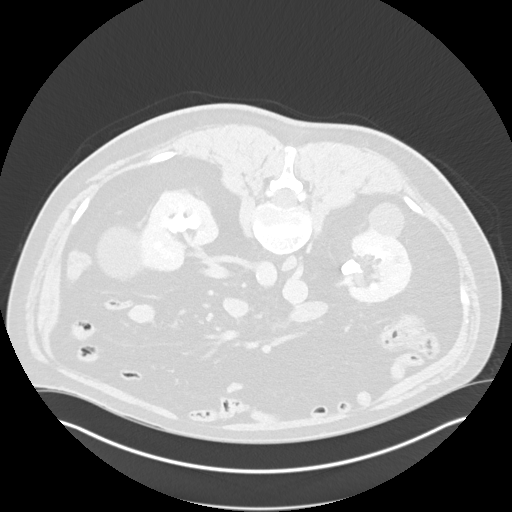
[im 82/109  soft-tissue]
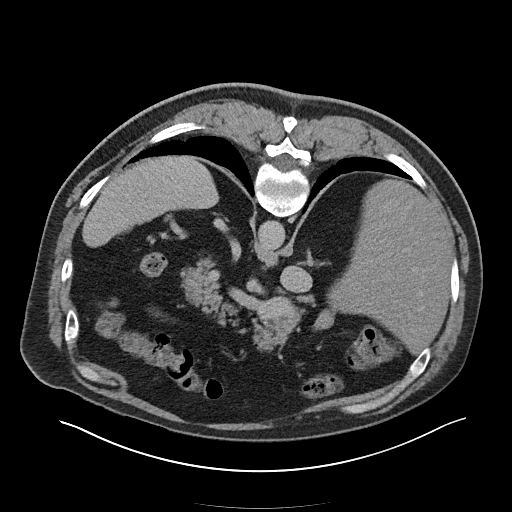
[im 82/109  lung]
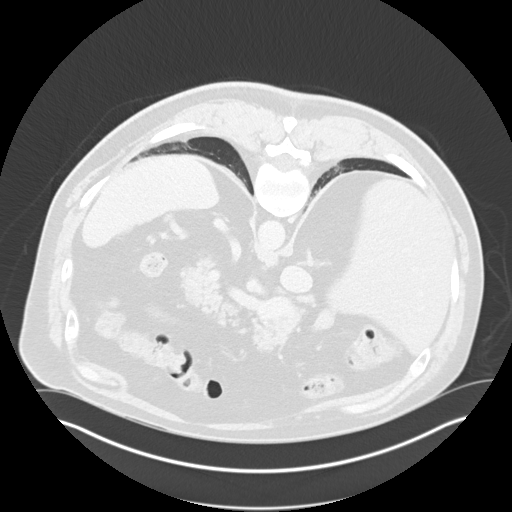
[im 95/109  soft-tissue]
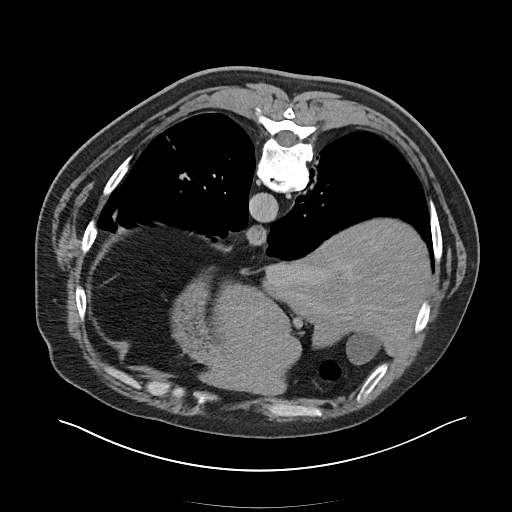
[im 95/109  lung]
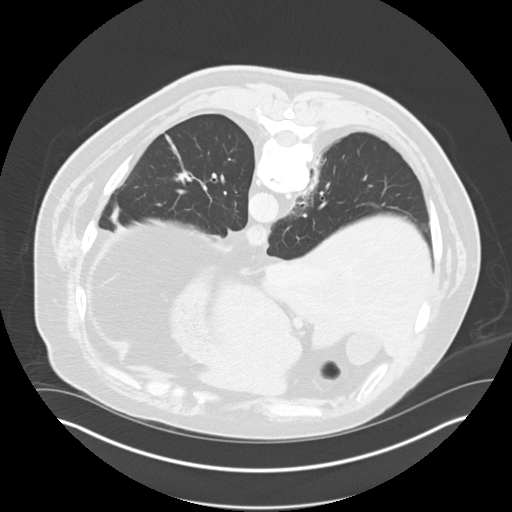

[Series 20: cor delay delay prone · coronal · delayed · 0.93mm/px · 2 of 192 slices shown, 3 images]
[im 64/192  soft-tissue]
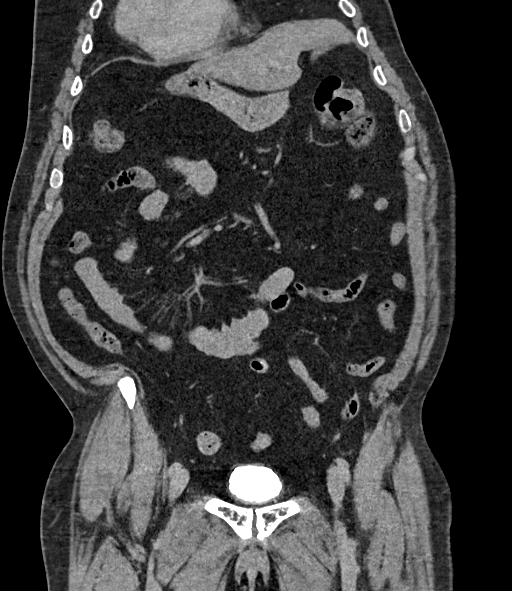
[im 64/192  bone]
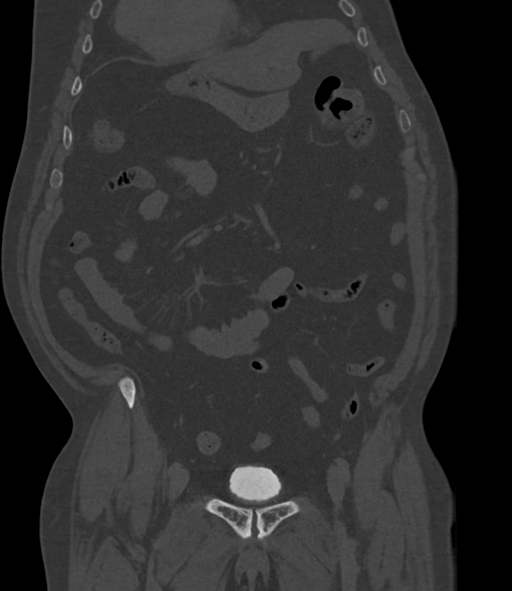
[im 128/192  soft-tissue]
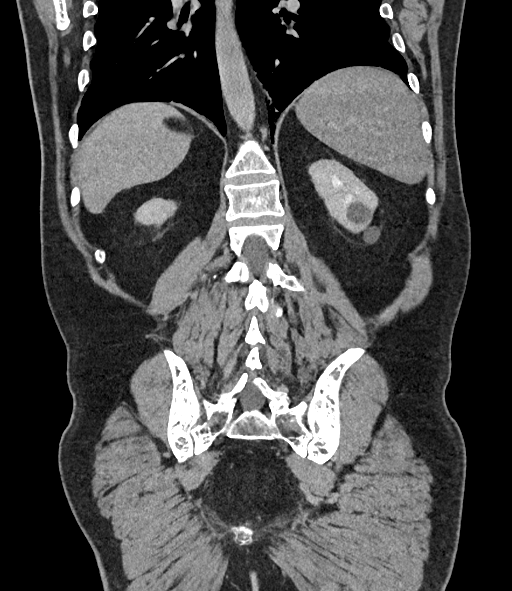

[12 of 46 positions shown; findings below may reference images not displayed]

FINDINGS: Lower Chest: Mild bibasilar scarring or atelectasis.

Hepatobiliary: No hepatic masses identified. Gallbladder is
unremarkable.

Pancreas:  No mass or inflammatory changes.

Spleen: Within normal limits in size and appearance.

Adrenals/Urinary Tract: Normal adrenal glands. Several small renal
calculi are again seen bilaterally, with increase in size of largest
calculus in the upper pole collecting system of the left kidney
measuring 2.3 cm. A 3 mm calculus is again seen within the left
renal pelvis, however there is no evidence of hydronephrosis. No
evidence of ureteral calculi or dilatation. Several bilateral renal
parenchymal and left renal sinus cysts show no significant change.
No complex cystic or solid renal masses are identified. No masses
are seen involving the ureters or urinary bladder.

Stomach/Bowel: No evidence of obstruction, inflammatory process or
abnormal fluid collections.

Vascular/Lymphatic: No pathologically enlarged lymph nodes. No
abdominal aortic aneurysm.

Reproductive:  No mass or other significant abnormality.

Other: A enhancing soft tissue mass is seen in the anterior
abdominal wall at the umbilicus. This measures 2.5 x 2.6 cm on image
63/9, without significant change since previous study.

Musculoskeletal:  No suspicious bone lesions identified.
IMPRESSION: Bilateral nonobstructing renal calculi, largest measuring 2.3 cm in
the upper pole collecting system of the left kidney. 3 mm calculus
also noted in left renal pelvis. No evidence of ureteral calculi or
hydronephrosis.

No radiographic evidence of urinary tract neoplasm.

2.6 cm enhancing soft tissue mass in the anterior abdominal wall at
the umbilicus, likely due to benign soft tissue neoplasm or
granulation tissue. Recommend consideration of surgical excision.
